# Patient Record
Sex: Female | Born: 1935 | Race: White | Hispanic: No | Smoking: Former smoker
Health system: Southern US, Community
[De-identification: ages and names within clinical notes are randomized; demographics above are authoritative.]

## PROBLEM LIST (undated history)

## (undated) DIAGNOSIS — H9190 Unspecified hearing loss, unspecified ear: Secondary | ICD-10-CM

## (undated) DIAGNOSIS — M259 Joint disorder, unspecified: Secondary | ICD-10-CM

## (undated) DIAGNOSIS — B0229 Other postherpetic nervous system involvement: Secondary | ICD-10-CM

## (undated) HISTORY — PX: CHOLECYSTECTOMY: SHX55

## (undated) HISTORY — DX: Joint disorder, unspecified: M25.9

## (undated) HISTORY — DX: Unspecified hearing loss, unspecified ear: H91.90

## (undated) HISTORY — PX: EXTERNAL EAR SURGERY: SHX627

## (undated) HISTORY — DX: Other postherpetic nervous system involvement: B02.29

---

## 2001-03-04 ENCOUNTER — Ambulatory Visit (HOSPITAL_COMMUNITY): Admission: RE | Admit: 2001-03-04 | Discharge: 2001-03-04 | Payer: Self-pay | Admitting: Internal Medicine

## 2001-03-04 ENCOUNTER — Encounter: Payer: Self-pay | Admitting: Internal Medicine

## 2001-06-20 ENCOUNTER — Encounter: Payer: Self-pay | Admitting: Internal Medicine

## 2001-06-20 ENCOUNTER — Ambulatory Visit (HOSPITAL_COMMUNITY): Admission: RE | Admit: 2001-06-20 | Discharge: 2001-06-20 | Payer: Self-pay | Admitting: Internal Medicine

## 2001-11-09 ENCOUNTER — Encounter: Payer: Self-pay | Admitting: Internal Medicine

## 2001-11-09 ENCOUNTER — Ambulatory Visit (HOSPITAL_COMMUNITY): Admission: RE | Admit: 2001-11-09 | Discharge: 2001-11-09 | Payer: Self-pay | Admitting: Internal Medicine

## 2002-03-15 ENCOUNTER — Ambulatory Visit (HOSPITAL_COMMUNITY): Admission: RE | Admit: 2002-03-15 | Discharge: 2002-03-15 | Payer: Self-pay | Admitting: Internal Medicine

## 2002-03-15 ENCOUNTER — Encounter: Payer: Self-pay | Admitting: Internal Medicine

## 2002-04-26 ENCOUNTER — Ambulatory Visit (HOSPITAL_COMMUNITY): Admission: RE | Admit: 2002-04-26 | Discharge: 2002-04-26 | Payer: Self-pay | Admitting: Internal Medicine

## 2003-05-24 ENCOUNTER — Ambulatory Visit (HOSPITAL_COMMUNITY): Admission: RE | Admit: 2003-05-24 | Discharge: 2003-05-24 | Payer: Self-pay | Admitting: Internal Medicine

## 2003-05-24 ENCOUNTER — Encounter: Payer: Self-pay | Admitting: Internal Medicine

## 2004-02-03 ENCOUNTER — Emergency Department (HOSPITAL_COMMUNITY): Admission: EM | Admit: 2004-02-03 | Discharge: 2004-02-04 | Payer: Self-pay | Admitting: Emergency Medicine

## 2004-04-17 ENCOUNTER — Inpatient Hospital Stay (HOSPITAL_COMMUNITY): Admission: EM | Admit: 2004-04-17 | Discharge: 2004-04-18 | Payer: Self-pay | Admitting: Emergency Medicine

## 2004-06-13 ENCOUNTER — Ambulatory Visit (HOSPITAL_COMMUNITY): Admission: RE | Admit: 2004-06-13 | Discharge: 2004-06-13 | Payer: Self-pay | Admitting: Internal Medicine

## 2005-09-21 ENCOUNTER — Ambulatory Visit (HOSPITAL_COMMUNITY): Admission: RE | Admit: 2005-09-21 | Discharge: 2005-09-21 | Payer: Self-pay | Admitting: Internal Medicine

## 2005-10-12 ENCOUNTER — Ambulatory Visit (HOSPITAL_COMMUNITY): Admission: RE | Admit: 2005-10-12 | Discharge: 2005-10-12 | Payer: Self-pay | Admitting: Internal Medicine

## 2006-09-30 ENCOUNTER — Ambulatory Visit (HOSPITAL_COMMUNITY): Admission: RE | Admit: 2006-09-30 | Discharge: 2006-09-30 | Payer: Self-pay | Admitting: Internal Medicine

## 2007-01-10 ENCOUNTER — Ambulatory Visit (HOSPITAL_COMMUNITY): Admission: RE | Admit: 2007-01-10 | Discharge: 2007-01-10 | Payer: Self-pay | Admitting: Internal Medicine

## 2007-10-04 ENCOUNTER — Ambulatory Visit (HOSPITAL_COMMUNITY): Admission: RE | Admit: 2007-10-04 | Discharge: 2007-10-04 | Payer: Self-pay | Admitting: Internal Medicine

## 2008-01-17 ENCOUNTER — Ambulatory Visit (HOSPITAL_COMMUNITY): Admission: RE | Admit: 2008-01-17 | Discharge: 2008-01-17 | Payer: Self-pay | Admitting: Internal Medicine

## 2008-02-03 ENCOUNTER — Ambulatory Visit: Payer: Self-pay | Admitting: Gastroenterology

## 2008-02-03 ENCOUNTER — Inpatient Hospital Stay (HOSPITAL_COMMUNITY): Admission: EM | Admit: 2008-02-03 | Discharge: 2008-02-06 | Payer: Self-pay | Admitting: Emergency Medicine

## 2008-02-04 ENCOUNTER — Ambulatory Visit: Payer: Self-pay | Admitting: Gastroenterology

## 2008-02-06 ENCOUNTER — Ambulatory Visit: Payer: Self-pay | Admitting: Gastroenterology

## 2008-02-13 ENCOUNTER — Ambulatory Visit (HOSPITAL_COMMUNITY): Admission: RE | Admit: 2008-02-13 | Discharge: 2008-02-13 | Payer: Self-pay | Admitting: Internal Medicine

## 2008-03-06 ENCOUNTER — Ambulatory Visit (HOSPITAL_COMMUNITY): Admission: RE | Admit: 2008-03-06 | Discharge: 2008-03-06 | Payer: Self-pay | Admitting: Gastroenterology

## 2008-03-12 ENCOUNTER — Ambulatory Visit: Payer: Self-pay | Admitting: Gastroenterology

## 2008-10-08 ENCOUNTER — Ambulatory Visit (HOSPITAL_COMMUNITY): Admission: RE | Admit: 2008-10-08 | Discharge: 2008-10-08 | Payer: Self-pay | Admitting: Internal Medicine

## 2009-10-23 ENCOUNTER — Ambulatory Visit (HOSPITAL_COMMUNITY): Admission: RE | Admit: 2009-10-23 | Discharge: 2009-10-23 | Payer: Self-pay | Admitting: Internal Medicine

## 2010-10-31 ENCOUNTER — Ambulatory Visit (HOSPITAL_COMMUNITY)
Admission: RE | Admit: 2010-10-31 | Discharge: 2010-10-31 | Payer: Self-pay | Source: Home / Self Care | Attending: Internal Medicine | Admitting: Internal Medicine

## 2011-03-17 NOTE — Op Note (Signed)
Sylvia Turner, Sylvia Turner                ACCOUNT NO.:  [id number]   MEDICAL RECORD NO.:  [id number]          PATIENT TYPE:  INP   LOCATION:  A215                          FACILITY:  [facility]   PHYSICIAN:  Kassie Mends, M.D.      DATE OF BIRTH:  [birth year]   DATE OF PROCEDURE:  02/03/2008  DATE OF DISCHARGE:                               OPERATIVE REPORT   REFERRING PHYSICIAN:  Kingsley Callander. Ouida Sills, MD   PROCEDURE:  Limited colonoscopy.   INDICATION FOR EXAM:  Sylvia Turner is a 75 year old female who presented  with painless rectal bleeding.  Nuclear medicine scan done today showed  active bleeding in the descending colon.  She has a past medical history  of diverticulosis.   FINDINGS:  1. Scope advanced to approximately 90 cm from the anal verge.  It      appeared to be in the mid transverse colon.  A large amount of old      blood clots and stool limited any further advancement of the scope.      The scope was slowly withdrawn to try to identify any active      bleeding from a diverticulum.  She had fresh blood clots in her      colon beginning in the rectum, and extending into approximately the      mid transverse colon.  No active bleeding was noted.  No ulcers      were seen.  She had multiple descending colon, and sigmoid colon      diverticula.  Her diverticulosis appeared to be most pronounced      between 50 and 70 cm from the anal verge.  No polyps, inflammatory      changes, or AVMs were seen.  Views of the mucosa were slightly      limited due to fresh blood in the colon lumen.  The residual blood      left in the colon to be easily washed away and aspirated.  2. Small internal hemorrhoids otherwise normal retroflex view of the      rectum.   DIAGNOSIS:  Most likely reason for Sylvia Turner's rectal bleeding is a  diverticular bleed.   RECOMMENDATIONS:  1. May advance to full liquid diet.  She does not appear to have any      additional active bleeding.  2. Hemoglobin has dropped and 12 to  9.4.  Will transfuse 1 unit of      packed red blood cells.  3. If she re-bleeds rather, would consider angiography to stop the      bleeding.  The other alternative is a surgery consult a left      hemicolectomy.  4. She should avoid aspirin, NSAIDs, and anticoagulation.  5. Daily PPI.   MEDICATIONS:  1. Demerol 50 mg IV.  2. Versed 4 mg IV.   PROCEDURE TECHNIQUE:  Physical exam was performed and an informed  consent was obtained from the patient after explaining the benefits,  risks, and alternatives to the procedure.  The patient was connected to  the monitor, and placed in  the left lateral position.  Continuous oxygen  was provided by nasal cannula and IV medicine administered through an  indwelling cannula.  After administration of sedation and a rectal exam,  the patient's rectum  was intubated, and the scope was advanced under direct visualization to  the mid transverse colon.  The scope was removed slowly by carefully  examining the color, texture, anatomy, and integrity of the mucosa on  the way out.  The patient was recovered in endoscopy, and discharged to  the floor in satisfactory condition.      Kassie Mends, M.D.  Electronically Signed     SM/MEDQ  D:  02/03/2008  T:  02/03/2008  Job:  [id number]   cc:   Kingsley Callander. Ouida Sills, MD  Fax: [phone]

## 2011-03-17 NOTE — Consult Note (Signed)
NAMEROGENE, METH                ACCOUNT NO.:  [id number]   MEDICAL RECORD NO.:  [id number]          PATIENT TYPE:  INP   LOCATION:  A215                          FACILITY:  [facility]   PHYSICIAN:  Kassie Mends, M.D.      DATE OF BIRTH:  1935/12/29   DATE OF CONSULTATION:  02/03/2008  DATE OF DISCHARGE:                                 CONSULTATION   REASON FOR CONSULTATION:  Hematochezia.   REQUESTING PHYSICIAN:  Kingsley Callander. Ouida Sills, M.D.   HISTORY OF PRESENT ILLNESS:  The patient is a 75 year old Caucasian  female who presented with acute onset pain with large volume  hematochezia with blood clots. She said she developed diarrhea last  week. She thought it was related to a medication that she was given, but  she cannot recall the name of it. It was potentially an NSAID. She does  not believe it was an antibiotic. She was out of town and stopped the  medication, and her diarrhea initially improved. Last weekend, she  decided to resume the medication as her physician had recommended this.  Over the weekend, her diarrhea returned. She states she has some  hemorrhoidal irritation with some bright red blood noted on the toilet  tissue. Yesterday, evening, however, she got the urge to have a bowel  movement. She went to the bathroom and passed large amounts of fresh  blood with clots. She has had five or six episodes. She denies any  abdominal pain, nausea or vomiting, fever or chills. She has occasional  heartburn which she takes ranitidine for. Chronically, she has some mild  constipation which she treats with Metamucil. No dysphagia or  odynophagia. She denies any lightheadedness, chest pain, or shortness of  breath. She had a colonoscopy in May of 2003 by Dr. Jena Gauss. She had  sigmoid diverticulosis. No family history of colorectal cancer.   MEDICATIONS AT HOME:  1. Magnesium 800 mg daily.  2. Methylprednisone 4 mg daily.  3. Ranitidine 150 mg every night.  4. Levothyroxine 88 mcg daily.  5.  Xanax 0.125 mg every night.  6. Multivitamin daily.  7. One teaspoon mustard p.r.n.  8. Recently began a new medication she does not recall the name.   ALLERGIES:  BEE STINGS.   PAST MEDICAL HISTORY:  1. Hypothyroidism.  2. Osteoarthritis.  3. Polio as a child.  4. Hysterectomy.  5. Cholecystectomy.  6. Left ear surgery.  7. Eyelid surgery.  8. Colonoscopy as above.   FAMILY HISTORY:  Father deceased secondary to black-lung disease. Mother  had dementia and MI. Brother had diabetes. No family history of  colorectal cancer or IBD.   SOCIAL HISTORY:  She is married. 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 She had remote tobacco use,  smoked for about 10 years. No alcohol or drug use.   REVIEW OF SYSTEMS:  See HPI for GI and constitutional. No weight loss.  See HPI for cardiopulmonary. GENITOURINARY:  No dysuria, hematuria.   PHYSICAL EXAMINATION:  Temperature 97.7, pulse 69, respirations 20,  blood pressure 143/80,  O2 saturation 90% on room air. Height 62 inches,  weight 66.1 kg.  GENERAL:  Pleasant, well-developed, well-nourished, elderly, Caucasian  female in no acute distress.  SKIN:  Warm and dry. No jaundice.  HEENT:  Sclerae nonicteric. Oropharyngeal mucosa moist and pink. No  lymphadenopathy.  CHEST:  Lungs are clear to auscultation.  CARDIAC:  Reveals regular rate and rhythm. Normal S1 and S2. No murmurs,  rubs, or gallops.  ABDOMEN:  Positive bowel sounds. Abdomen soft, nontender, nondistended.  No organomegaly or masses. No rebound or guarding. No abdominal bruits  or hernias.  RECTAL EXAM:  Done in the ED, she was found to have gross blood per  rectum.  LOWER EXTREMITIES:  No edema.   LABORATORY DATA:  White count 15,400, hemoglobin 12.1 on admission which  was at 1:00 a.m. this morning - her hemoglobin is now 10.1, platelets  337,000. Sodium 135, potassium 4.3, BUN 20, creatinine 0.68, glucose  149. INR 0.9, PTT  33.   IMPRESSION:  The patient is a 75 year old lady admitted with acute  onset, painless, large volume hematochezia, likely due to a diverticular  bleed. She possibly had recent NSAID use over the past two weeks, and if  so, we cannot exclude the possibility of NSAID-induced colopathy. Less  likely, dealing with colorectal cancer.   RECOMMENDATIONS:  1. Will retrieve name of recent medication.  2. Serial H&H, transfuse as needed.  3. Would provide supportive measures and monitor for now. If the      bleeding stops within the next 24 hours, would offer her an      outpatient colonoscopy to rule out other causes of bleeding given      that her colonoscopy was over six years ago. If her bleeding does      not stop, then she would likely need a colonoscopy while inpatient.      I discussed the above findings with Dr. Cira Servant. Plan is outlined per      her recommendations. Would continue clear liquid diet for now. I      would like to thank Dr. Carylon Perches for allowing [place] to take part in      the care of this patient.   ADDENDUM:  Spoke with patient. She denies any abdominal associated were  her rectal bleeding. She has had 5 episodes. The last one was at 1015.  She said it was a smaller amount and less red. The most likely etiology  for her BRBPR is a diverticular bleed. She has a low likelihood that  this is an NSAID-induced colon ulcer/colitis.  She was taking Diclofenac  as an outpatient. Agree with supportive mesaures and will obtain a  bleeding scan. If bleeding  stops, and Hb stable, paatient may be discharged on Sat or Sun. She  requests any outpatient procedures be scheduled after April 9. If  bleeding persists and Nuclear medicine scan is negative, would plan to  perform TCS on Sunday or Monday or would consider repeat Nuc Med scan +/-  angiography.      Tana Coast, P.A.      Kassie Mends, M.D.  Electronically Signed    LL/MEDQ  D:  02/03/2008  T:  02/03/2008  Job:   [id number]   cc:   Kingsley Callander. Ouida Sills, MD  Fax: [phone]

## 2011-03-17 NOTE — Discharge Summary (Signed)
NAME:  Sylvia Turner, Sylvia Turner                ACCOUNT NO.:  [id number]   MEDICAL RECORD NO.:  [id number]          PATIENT TYPE:  INP   LOCATION:  A215                          FACILITY:  [facility]   PHYSICIAN:  Kingsley Callander. Ouida Sills, MD       DATE OF BIRTH:  1936-11-02   DATE OF ADMISSION:  02/03/2008  DATE OF DISCHARGE:  LH                               DISCHARGE SUMMARY   DISCHARGE DIAGNOSES:  1. Lower gastrointestinal bleed likely due to diverticulosis.  2. Hypothyroidism.  3. Left thoracic radiculopathy.  4. History of polio.   HOSPITAL COURSE:  This patient is a 75 year old female who presented  with bright red painless rectal bleeding.  Her initial hemoglobin was  12.1.  A nuclear medicine bleeding scan revealed bleeding from the  descending colon.  She underwent a flexible sigmoidoscopy by Dr. Cira Servant.  Fresh blood and clots were seen in the descending colon with active  bleeding noted.  She received 3 units of packed red cells.  Her bleeding  stopped.  Her hemoglobin dropped from 11.1 to 9.8 from February 05, 2008, to  February 06, 2008.  She has had no bleeding and no hypotension.  She will be  discharged, but will be seen in followup in 2 days with a repeat  hemoglobin.  She will undergo an outpatient colonoscopy in 1-2 weeks.   DISCHARGE MEDICATIONS:  1. Synthroid 88 mcg daily.  2. Alprazolam 0.25 mg nightly.  3. Multivitamin daily.  4. Magnesium daily.  5. Potassium daily.  6. Calcium daily.  7. Vitamin D daily.   She was advised to avoid all aspirin and nonsteroidal anti-inflammatory  drugs.      Kingsley Callander. Ouida Sills, MD  Electronically Signed     ROF/MEDQ  D:  02/06/2008  T:  02/06/2008  Job:  [id number]

## 2011-03-17 NOTE — H&P (Signed)
NAME:  Sylvia Turner, Sylvia Turner                ACCOUNT NO.:  [id number]   MEDICAL RECORD NO.:  [id number]          PATIENT TYPE:  INP   LOCATION:  A215                          FACILITY:  [facility]   PHYSICIAN:  Kingsley Callander. Ouida Sills, MD       DATE OF BIRTH:  [birth year]   DATE OF ADMISSION:  02/03/2008  DATE OF DISCHARGE:  LH                              HISTORY & PHYSICAL   CHIEF COMPLAINT:  Rectal bleeding.   HISTORY OF PRESENT ILLNESS:  This patient is a 75 year old white female  who presented to the emergency room after developing rectal bleeding.  She first began bleeding about 11:00 p.m. She did not experience  abdominal pain or perianal pain. She went to bed and then got back up  and had continued bleeding and came to the emergency room. She was not  hypotensive nor anemic. She has a history of diverticulosis on  colonoscopy in 2003 by Dr. Jena Gauss.  She has taken two doses of Advil this  week. She has been on Medrol dose back for a thoracic radiculopathy. She  is not on Coumadin or daily aspirin.   PAST MEDICAL HISTORY:  Hysterectomy, cholecystectomy, left ear surgery,  eyelid surgery, left thoracic radiculopathy, polio with resultant left  leg atrophy, hypothyroidism, insomnia, bee sting allergy.   MEDICATIONS:  Magnesium, potassium, calcium, vitamin D, multivitamin,  Synthroid 88 mcg daily, alprazolam 0.25 mg q.h.s. Medrol Dosepak.   ALLERGIES:  Bee stings.   SOCIAL HISTORY:  She does not smoke cigarettes, drink alcohol or use  recreational drugs.   FAMILY HISTORY:  Her father had lung cancer.  Mother had dementia.  A  brother and sister have had diabetes and cirrhosis.   REVIEW OF SYSTEMS:  No chest pain, syncope, shortness of breath,  abdominal pain.  She had persistent radicular left thoracic pain. A  recent chest x-ray and rib x-rays were negative.   PHYSICAL EXAMINATION:  Pulse 84, blood pressure 140/80.  GENERAL:  Alert and in no distress.  HEENT:  No scleral icterus. Mucus membranes  appear normal.  Oropharynx  unremarkable.  NECK:  No JVD or thyromegaly.  LUNGS:  Clear.  HEART:  Regular with no murmurs.  ABDOMEN:  Nontender.  No hepatosplenomegaly.  EXTREMITIES:  She has atrophy of the left leg, no edema.  NEURO:  Grossly intact.  LYMPH NODES:  No enlargement.  SKIN:  Warm and dry.   LABORATORY DATA:  Hemoglobin 12.1, white count 15,000. Met 7 is normal  except for glucose in the 150 range.   IMPRESSION:  1. Rectal bleeding. She has known diverticulosis.  There is no sign of      polyp or tumor in 2003.  Will consult gastroenterology for      additional evaluation.  A repeat hemoglobin is pending. She was      started on IV Protonix empirically.  2. Hypothyroidism.  Continue Synthroid.  3. Insomnia.  Continue alprazolam.  4. History of polio.  5. Leukocytosis.  6. Left thoracic radiculopathy. Will hold her Medrol Dosepak for now.      Kingsley Callander. Ouida Sills,  MD  Electronically Signed     ROF/MEDQ  D:  02/03/2008  T:  02/03/2008  Job:  [id number]

## 2011-03-17 NOTE — Op Note (Signed)
NAME:  Sylvia Turner, Sylvia Turner                ACCOUNT NO.:  [id number]   MEDICAL RECORD NO.:  [id number]          PATIENT TYPE:  AMB   LOCATION:  DAY                           FACILITY:  [facility]   PHYSICIAN:  Kassie Mends, M.D.      DATE OF BIRTH:  May 30, 1936   DATE OF PROCEDURE:  03/06/2008  DATE OF DISCHARGE:                               OPERATIVE REPORT   REFERRING PHYSICIAN:  Kingsley Callander. Ouida Sills, MD.   PROCEDURE:  Colonoscopy.   INDICATION FOR EXAM:  Sylvia Turner is a 75 year old female who was placed  on anti-inflammatory drug and developed rectal bleeding.  She had a  nuclear medicine scan in April 2009 which revealed bleeding in the  descending colon.  The limited colonoscopy that was performed during  that hospitalization showed a large amount of old blood and clots as  well as stool which limited a complete exam.  She presents with no  further rectal bleeding or abdominal pain.   FINDINGS:  Descending colon and sigmoid colon diverticula, which were  frequent.  Large internal hemorrhoids.  Otherwise no polyps, masses,  inflammatory changes, or AVM seen.   DIAGNOSIS:  The most likely reason for Sylvia Turner rectal bleeding was a  diverticular bleed.  She has no evidence of diverticula in the right  colon or the transverse colon.   RECOMMENDATIONS:  1. She should have a screening colonoscopy in 10 years.  2. She should follow a high-fiber diet.  She is given a handout on      high-fiber diet, diverticulosis, and hemorrhoids.  3. She should avoid the anti-inflammatory drug that precipitated this      event.   MEDICATIONS:  1. Demerol 50 mg IV.  2. Versed 4 mg IV.   PROCEDURE TECHNIQUE:  Physical exam was performed.  Informed consent was  obtained from the patient after explaining the benefits, risks, and  alternatives to the procedure.  The patient was connected to the monitor  and placed in left lateral position.  Continuous oxygen was provided via  nasal cannula and IV medicine  administered through an indwelling  cannula.  After administration of sedation and rectal exam, the  patient's rectum was intubated  and the scope was advanced under direct visualization to the cecum.  The  prep was excellent.  The scope was removed slowly by carefully examining  the color, texture, anatomy, and integrity of the mucosa on the way out.  The patient was recovered in endoscopy and discharged home in  satisfactory condition.      Kassie Mends, M.D.  Electronically Signed     SM/MEDQ  D:  03/06/2008  T:  03/06/2008  Job:  [id number]   cc:   Kingsley Callander. Ouida Sills, MD  Fax: [phone]

## 2011-03-17 NOTE — Group Therapy Note (Signed)
NAME:  Sylvia Turner, MOUSTAFA                ACCOUNT NO.:  [id number]   MEDICAL RECORD NO.:  [id number]          PATIENT TYPE:  INP   LOCATION:  A215                          FACILITY:  [facility]   PHYSICIAN:  Angus G. Renard Matter, MD   DATE OF BIRTH:  02/12/1936   DATE OF PROCEDURE:  DATE OF DISCHARGE:                                 PROGRESS NOTE   This patient admitted with rectal bleeding without abdominal pain or  perineal pain.  She has remained normotensive.  She did have to have  another unit of packed cells yesterday, is being seen by GI service.   OBJECTIVE:  VITAL SIGNS:  Blood pressure 105/63, respiration 20, pulse  69, temperature 96.8.  Her current hemoglobin 8.6, hematocrit 24.0.  Chemistries within normal range.  HEART:  Regular rhythm.  LUNGS:  Clear to P&A.  ABDOMEN:  No palpable organs or masses   ASSESSMENT:  The patient does have history of rectal bleeding, remains  anemic; does have hypothyroidism, insomnia, leukocytosis.  Will continue  to replace blood, continue current regimen.      Angus G. Renard Matter, MD  Electronically Signed     AGM/MEDQ  D:  02/05/2008  T:  02/05/2008  Job:  [id number]

## 2011-03-17 NOTE — Group Therapy Note (Signed)
NAME:  Sylvia Turner, Sylvia Turner                ACCOUNT NO.:  [id number]   MEDICAL RECORD NO.:  [id number]          PATIENT TYPE:  INP   LOCATION:  A215                          FACILITY:  [facility]   PHYSICIAN:  Angus G. Renard Matter, MD   DATE OF BIRTH:  September 09, 1936   DATE OF PROCEDURE:  DATE OF DISCHARGE:                                 PROGRESS NOTE   ASSESSMENT:  The patient was admitted with rectal bleeding without  abdominal or perineal pain. She remained normotensive. History of  diverticulosis noted by colonoscopy 2003.  She has received a unit of  blood, is being seen by GI service.   OBJECTIVE:  VITAL SIGNS:  Blood pressure 113/57, respiration 20, pulse  78, temp 98.1. Most recent hemoglobin 8.0. RBC's 2.56, hematocrit 22.6.  CHEMISTRIES:  Essentially normal.  HEART:  Regular rhythm.  LUNGS:  Clear to P&A.  ABDOMEN:  No palpable organs or masses.  No organomegaly.   ASSESSMENT:  Patient admitted with rectal bleeding. She remains anemic.  Does have hypothyroidism, insomnia, leukocytosis.   PLAN:  To continue to replace blood. Will discuss with GI service.      Angus G. Renard Matter, MD  Electronically Signed     AGM/MEDQ  D:  02/04/2008  T:  02/04/2008  Job:  [id number]

## 2011-03-20 NOTE — Op Note (Signed)
[facility]  Patient:    Sylvia Turner, Sylvia Turner Visit Number: [id number] MRN: [id number]          Service Type: END Location: DAY Attending Physician:  Jonathon Bellows Dictated by:   Roetta Sessions, M.D. Proc. Date: 04/26/02 Admit Date:  04/26/2002   CC:         Carylon Perches, M.D.   Operative Report  PROCEDURE:  Screening colonoscopy.  REFERRING PHYSICIAN:  Carylon Perches, M.D.  INDICATION FOR PROCEDURE:  The patient is a 75 year old lady followed primarily by Dr. Ouida Sills referred for colorectal cancer screening. She is devoid of any large GI tract symptoms. No family history of colorectal cancer. Prior imaging of her colon via sigmoidoscopy five years ago demonstrated only diverticulosis. Colonoscopy is now being done as standard screening maneuvers and the approach has been discussed with the patient. The potential risks, benefits, and alternatives have been reviewed and questions answered. She is agreeable. She is low-risk for conscious sedation. Please see my handwritten H&P for more information.  PROCEDURE NOTE:  O2 saturation, blood pressure, pulse, and respirations were monitored throughout the entire procedure.  ANESTHESIA:  Conscious sedation, Versed 4 mg IV, Demerol 75 mg IV in divided doses.  INSTRUMENT:  Olympus videoscopic colonoscope.  FINDINGS:  Digital rectal exam revealed no abnormalities.  ENDOSCOPIC FINDINGS:  The prep was good.  Rectum and colon examination:  The rectal mucosa including retroflexed view of the anal verge revealed no abnormalities.  Descending colon:   The colonic mucosa was surveyed from the rectosigmoid junction through the left transverse right colon to the area of the appendiceal orifice, ileocecal valve, and cecum. These structures were all seen and photographed for the record. The patient was noted to have sigmoid diverticula. The remainder of the colonic mucosa appeared normal from the level of the cecum and  ileocecal valve. The scope was slowly and cautiously withdrawn. All previously mentioned mucosal surfaces were again seen. Again, no other abnormalities were observed. The patient tolerated the procedure well and is reacting.  ENDOSCOPY IMPRESSION: 1. Normal rectum. 2. Sigmoid diverticulosis. Remaining colonic mucosal appeared normal.  RECOMMENDATIONS: 1. Diverticulosis literature provided to the patient. 2. Repeat colonoscopy in 10 years. 3. Follow up with Dr. Ouida Sills. Dictated by:   Roetta Sessions, M.D. Attending Physician:  Jonathon Bellows DD:  04/26/02 TD:  04/27/02 Job: [id number]

## 2011-03-20 NOTE — Discharge Summary (Signed)
NAME:  Sylvia Turner, Sylvia Turner                          ACCOUNT NO.:  [id number]   MEDICAL RECORD NO.:  [id number]                   PATIENT TYPE:  INP   LOCATION:  A209                                 FACILITY:  [facility]   PHYSICIAN:  Kingsley Callander. Ouida Sills, M.D.                  DATE OF BIRTH:  1936/03/29   DATE OF ADMISSION:  04/17/2004  DATE OF DISCHARGE:  04/18/2004                                 DISCHARGE SUMMARY   DISCHARGE DIAGNOSES:  1. Bee sting on the right first toe.  2. Fever.  3. Hypothyroidism.  4. History of polio.   HOSPITAL COURSE:  This patient is a 75 year old female who presented to the  emergency room one day after a bee sting to the right first toe.  She  developed swelling, bruising and redness.  In the emergency room, she had a  fever to 103.8.  There was a substantial change in her blood pressure while  observed there with a drop from 157/70 to 99/51.  She was treated with IV  fluids.  She was hospitalized for observation and treated with Benadryl.  Pulse rate dropped from 128 to 99.  She had a normal white count at 7.8 with  95% neutrophils and 3% lymphocytes.  Her chest x-ray revealed no infiltrate.  Her urinalysis was negative.  Blood and urine cultures have thus far been  negative.  After hospitalization overnight, her fever had resolved.  Her  vital signs were normal, and she was felt to be stable for discharge.  The  swelling in her right foot improved.   She had taken an EpiPen at home.  She has not had tongue or lip swelling, or  any glottic closure-type symptoms.  She could probably treat any future bee  stings with Benadryl only.   DISCHARGE MEDICATIONS:  1. Synthroid 88 micrograms daily.  2. Alprazolam 0.25 mg q.h.s.  3. Calcium 1500 mg daily.  4. Vitamin D, 400 IU daily.  5. Multivitamin daily.     ___________________________________________                                         Kingsley Callander. Ouida Sills, M.D.   ROF/MEDQ  D:  04/18/2004  T:  04/19/2004  Job:  [id number]

## 2011-03-20 NOTE — H&P (Signed)
NAME:  Sylvia Turner, Sylvia Turner                          ACCOUNT NO.:  [id number]   MEDICAL RECORD NO.:  [id number]                   PATIENT TYPE:  INP   LOCATION:  A209                                 FACILITY:  [facility]   PHYSICIAN:  Kingsley Callander. Ouida Sills, M.D.                  DATE OF BIRTH:  1936-03-15   DATE OF ADMISSION:  04/17/2004  DATE OF DISCHARGE:                                HISTORY & PHYSICAL   CHIEF COMPLAINT:  Bee sting.   HISTORY OF PRESENT ILLNESS:  This patient is a 75 year old, white female who  presented to the emergency room one day after being stung on the right first  toe by a yellow jacket.  She has a history of a bee sting allergy.  She  waited a couple of hours and then took an EpiPen.  She awakened at 4 a.m.  with rigors and vomiting.  She then presented to the emergency room where  she was found to have a temperature of 103.8.  She was initially tachycardic  at 128.  With observation in the emergency room, her blood pressure dropped  from 157/70 to 99/51.   PAST MEDICAL HISTORY:  1. Hypothyroidism.  2. Osteoarthritis.  3. Polio as a child.  4. Hysterectomy.  5. Cholecystectomy.  6. Left ear surgery.  7. Eye lid surgery.   MEDICATIONS:  1. Synthroid 88 mcg q.d.  2. Xanax 0.25 mg q.h.s.  3. Calcium.  4. Multivitamin p.r.n.  5. Quinine p.r.n.   ALLERGIES:  BEE STINGS.   SOCIAL HISTORY:  She does not smoke cigarettes, use drugs or abuse alcohol.   FAMILY HISTORY:  Her father died of lung cancer.  Her mother had an MI and  dementia.  A brother has diabetes.   REVIEW OF SYMPTOMS:  HEENT:  No tongue or lip swelling.  RESPIRATORY:  No  trouble breathing.  CARDIAC:  No chest pain.  GASTROINTESTINAL:  No  abdominal pain.  No diarrhea.  She has had no hives.   PHYSICAL EXAMINATION:  VITAL SIGNS:  Temperature 103.8, blood pressure  157/70, pulse 128, respirations 20, oxygen saturation 94%.  GENERAL:  Alert and fully oriented.  HEENT:  Eyes and oropharynx were  unremarkable.  She has an upper denture.  No JVD or thyromegaly.  LUNGS:  Clear.  No wheezes.  HEART:  Regular with no murmurs.  ABDOMEN:  Nontender with no hepatosplenomegaly.  EXTREMITIES:  She has swelling, bruising and redness on the distal foot most  notable over the first toe.  Pulses are intact.  There is atrophy of the  left leg with hammertoe deformities.  NEUROLOGIC:  Intact.  LYMPH NODES:  No enlargement.   LABORATORY DATA AND X-RAY FINDINGS:  White count 7.8, hemoglobin 12.8,  platelets 222.  Sodium 140, potassium 4.3, glucose 119, BUN 15, creatinine  0.8.  Urinalysis is negative.  CBC revealed a 95% neutrophils and 3%  lymphs.   Chest x-ray is normal.   IMPRESSION:  1. Bee sting.  2. Fever.  3. Initial tachycardia with subsequent blood pressure drop.   PLAN:  1. She will be hospitalized and observed.  2. Benadryl p.o.  3. Blood and urine cultures.  4. She was treated with 1 g of Rocephin in the ER.     ___________________________________________                                         Kingsley Callander. Ouida Sills, M.D.   ROF/MEDQ  D:  04/18/2004  T:  04/18/2004  Job:  [id number]

## 2011-07-28 LAB — DIFFERENTIAL
Basophils Relative: 0
Eosinophils Absolute: 0.1
Eosinophils Relative: 1
Monocytes Absolute: 0.9
Monocytes Relative: 5
Monocytes Relative: 9
Neutro Abs: 12.7 — ABNORMAL HIGH
Neutro Abs: 5.8
Neutrophils Relative %: 57

## 2011-07-28 LAB — HEMOGLOBIN AND HEMATOCRIT, BLOOD
HCT: 24 — ABNORMAL LOW
HCT: 26.2 — ABNORMAL LOW
HCT: 26.6 — ABNORMAL LOW
HCT: 27.1 — ABNORMAL LOW
HCT: 28.1 — ABNORMAL LOW
HCT: 29.3 — ABNORMAL LOW
Hemoglobin: 10.1 — ABNORMAL LOW
Hemoglobin: 8.6 — ABNORMAL LOW
Hemoglobin: 9.4 — ABNORMAL LOW
Hemoglobin: 9.5 — ABNORMAL LOW
Hemoglobin: 9.8 — ABNORMAL LOW

## 2011-07-28 LAB — CBC
Hemoglobin: 8 — ABNORMAL LOW
MCHC: 34.3
MCV: 87.8
Platelets: 229
RBC: 4.01
RDW: 13.4
RDW: 13.6
WBC: 15.4 — ABNORMAL HIGH

## 2011-07-28 LAB — PROTIME-INR: Prothrombin Time: 12.6

## 2011-07-28 LAB — ABO/RH: ABO/RH(D): O POS

## 2011-07-28 LAB — BASIC METABOLIC PANEL
CO2: 26
Calcium: 9.1
Creatinine, Ser: 0.68
GFR calc non Af Amer: >60
Sodium: 135

## 2011-07-28 LAB — TYPE AND SCREEN
ABO/RH(D): O POS
Antibody Screen: NEGATIVE

## 2011-07-28 LAB — PREPARE RBC (CROSSMATCH)

## 2011-12-29 ENCOUNTER — Other Ambulatory Visit (HOSPITAL_COMMUNITY): Payer: Self-pay | Admitting: Internal Medicine

## 2011-12-29 DIAGNOSIS — M5414 Radiculopathy, thoracic region: Secondary | ICD-10-CM

## 2011-12-30 ENCOUNTER — Ambulatory Visit (HOSPITAL_COMMUNITY)
Admission: RE | Admit: 2011-12-30 | Discharge: 2011-12-30 | Disposition: A | Discharge status: Home / Self Care | Payer: Medicare Other | Source: Ambulatory Visit | Attending: Internal Medicine | Admitting: Internal Medicine

## 2011-12-30 DIAGNOSIS — M5124 Other intervertebral disc displacement, thoracic region: Secondary | ICD-10-CM | POA: Insufficient documentation

## 2011-12-30 DIAGNOSIS — M5414 Radiculopathy, thoracic region: Secondary | ICD-10-CM

## 2011-12-30 DIAGNOSIS — M546 Pain in thoracic spine: Secondary | ICD-10-CM | POA: Insufficient documentation

## 2013-01-26 ENCOUNTER — Other Ambulatory Visit (HOSPITAL_COMMUNITY): Payer: Self-pay | Admitting: Internal Medicine

## 2013-01-26 DIAGNOSIS — Z139 Encounter for screening, unspecified: Secondary | ICD-10-CM

## 2013-02-14 ENCOUNTER — Ambulatory Visit (HOSPITAL_COMMUNITY)
Admission: RE | Admit: 2013-02-14 | Discharge: 2013-02-14 | Disposition: A | Discharge status: Home / Self Care | Payer: Medicare Other | Source: Ambulatory Visit | Attending: Internal Medicine | Admitting: Internal Medicine

## 2013-02-14 DIAGNOSIS — Z139 Encounter for screening, unspecified: Secondary | ICD-10-CM

## 2013-02-14 DIAGNOSIS — Z1231 Encounter for screening mammogram for malignant neoplasm of breast: Secondary | ICD-10-CM | POA: Insufficient documentation

## 2014-02-26 ENCOUNTER — Other Ambulatory Visit (HOSPITAL_COMMUNITY): Payer: Self-pay | Admitting: Internal Medicine

## 2014-02-26 DIAGNOSIS — Z1231 Encounter for screening mammogram for malignant neoplasm of breast: Secondary | ICD-10-CM

## 2014-03-01 ENCOUNTER — Ambulatory Visit (HOSPITAL_COMMUNITY)
Admission: RE | Admit: 2014-03-01 | Discharge: 2014-03-01 | Disposition: A | Discharge status: Home / Self Care | Payer: Medicare HMO | Source: Ambulatory Visit | Attending: Internal Medicine | Admitting: Internal Medicine

## 2014-03-01 DIAGNOSIS — Z1231 Encounter for screening mammogram for malignant neoplasm of breast: Secondary | ICD-10-CM | POA: Insufficient documentation

## 2014-10-17 ENCOUNTER — Ambulatory Visit (INDEPENDENT_AMBULATORY_CARE_PROVIDER_SITE_OTHER): Payer: Commercial Managed Care - HMO | Admitting: Diagnostic Neuroimaging

## 2014-10-17 ENCOUNTER — Encounter: Payer: Self-pay | Admitting: Diagnostic Neuroimaging

## 2014-10-17 VITALS — BP 124/67 | HR 68 | Temp 97.8°F | Ht 61.5 in | Wt 139.8 lb

## 2014-10-17 DIAGNOSIS — B0229 Other postherpetic nervous system involvement: Secondary | ICD-10-CM

## 2014-10-17 NOTE — Patient Instructions (Signed)
Use lyrica as needed during flare up of post herpetic neuralgia pain.

## 2014-10-17 NOTE — Progress Notes (Signed)
[facility]  PATIENT: Sylvia Turner DOB: [birth year]  REFERRING CLINICIAN: R Fagan HISTORY FROM: patient  REASON FOR VISIT: new consult    HISTORICAL  CHIEF COMPLAINT:  Chief Complaint  Patient presents with  . Neurologic Problem    neuralgia    HISTORY OF PRESENT ILLNESS:   78 year old female with hypothyroidism, restless legs, insomnia, here for evaluation of post herpetic neuralgia. 2007 patient had new onset rash in left thoracic region, T5 dermatome, with electrical and shooting pain. Patient was diagnosed with shingles outbreak and treated with valacyclovir. Symptoms improved and she did well until 2009. Patient had recurrence of symptoms and was treated with Valtrex and Lyrica. Symptoms improved for some time. She has had intermittent flareups of symptoms over time and had follow-up evaluation with MRI of the thoracic spine. Patient was then referred to pain management due to presence of small disc protrusion/herniation at the T2-3 and T6-7 levels. Patient had intermittent treatment with Lyrica and Valtrex for recurrent symptoms. Last flareup of symptoms March 2015. Currently patient has no significant pain related to postherpetic neuralgia. Patient had tried gabapentin in the past but could not tolerate it. Patient is reluctant to take maintenance Lyrica due to concern of long-term side effects and cost.  REVIEW OF SYSTEMS: Full 14 system review of systems performed and notable only for easy bruising joint pain cramps allergy runny nose constipation hearing loss rash.  ALLERGIES: Allergies  Allergen Reactions  . Other     Ex. Perfume smells, candle shop    HOME MEDICATIONS: No outpatient prescriptions prior to visit.   No facility-administered medications prior to visit.    PAST MEDICAL HISTORY: Past Medical History  Diagnosis Date  . Deaf L Ear  . Post herpetic neuralgia   . Hip problem     PAST SURGICAL HISTORY: Past Surgical History    Procedure Laterality Date  . External ear surgery    . Cholecystectomy      FAMILY HISTORY: Family History  Problem Relation Age of Onset  . Dementia Mother   . Other Father     Black Lung    SOCIAL HISTORY:  History   Social History  . Marital Status: Married    Spouse Name: Margarette Asal    Number of Children: 3  . Years of Education: 12+   Occupational History  . Retired    Social History Main Topics  . Smoking status: Former Smoker -- 0.50 packs/day for 10 years    Types: Cigarettes    Quit date: 11/02/1988  . Smokeless tobacco: Never Used  . Alcohol Use: 0.0 oz/week    0 Not specified per week     Comment: rarely 1 glass of wine  . Drug Use: No  . Sexual Activity: Not on file   Other Topics Concern  . Not on file   Social History Narrative   Patient lives at home with spouse.   Caffeine Use: 3 cups daily     PHYSICAL EXAM  Filed Vitals:   10/17/14 0842  BP: 124/67  Pulse: 68  Temp: 97.8 F (36.6 C)  TempSrc: Oral  Height: 5' 1.5" (1.562 m)  Weight: 139 lb 12.8 oz (63.413 kg)    Body mass index is 25.99 kg/(m^2).   Visual Acuity Screening   Right eye Left eye Both eyes  Without correction: 20/40 20/50   With correction:       No flowsheet data found.  GENERAL EXAM: Patient is in no distress; well  developed, nourished and groomed; neck is supple  CARDIOVASCULAR: Regular rate and rhythm, no murmurs, no carotid bruits  NEUROLOGIC: MENTAL STATUS: awake, alert, oriented to person, place and time, recent and remote memory intact, normal attention and concentration, language fluent, comprehension intact, naming intact, fund of knowledge appropriate CRANIAL NERVE: no papilledema on fundoscopic exam, pupils equal and reactive to light, visual fields full to confrontation, extraocular muscles intact, no nystagmus, facial sensation and strength symmetric, hearing intact, palate elevates symmetrically, uvula midline, shoulder shrug symmetric,  tongue midline. MOTOR: normal bulk and tone, full strength in the BUE, BLE; EXCEPT ATROPHY OF LEFT LOWER LEG/FOOT FROM POLIO SENSORY: normal and symmetric to light touch, pinprick, temperature, vibration COORDINATION: finger-nose-finger, fine finger movements normal REFLEXES: deep tendon reflexes present and symmetric; ABSENT IN LEFT LEG GAIT/STATION: narrow based gait; ANTALGIC GAIT FROM RIGHT HIP PAIN; romberg is negative    DIAGNOSTIC DATA (LABS, IMAGING, TESTING) - I reviewed patient records, labs, notes, testing and imaging myself where available.  Lab Results  Component Value Date   WBC 10.2 02/04/2008   HGB 9.8* 02/06/2008   HCT 28.1* 02/06/2008   MCV 88.0 02/04/2008   PLT 229 DELTA CHECK NOTED 02/04/2008      Component Value Date/Time   NA 135 02/03/2008 0117   K 4.3 02/03/2008 0117   CL 104 02/03/2008 0117   CO2 26 02/03/2008 0117   GLUCOSE 149* 02/03/2008 0117   BUN 20 02/03/2008 0117   CREATININE 0.68 02/03/2008 0117   CALCIUM 9.1 02/03/2008 0117   GFRNONAA >60 02/03/2008 0117   GFRAA  02/03/2008 0117    >60        The eGFR has been calculated using the MDRD equation. This calculation has not been validated in all clinical   No results found for: CHOL, HDL, LDLCALC, LDLDIRECT, TRIG, CHOLHDL No results found for: HGBA1C No results found for: VITAMINB12 No results found for: TSH   I reviewed images myself and agree with interpretation. Also degenerative disc bulging and spondylosis from C4-5 to C7-T1 noted.  -VRP  02/14/08 MRI thoracic spine - Small left paracentral disc herniation at T2-3 which could possibly relate to the patient's symptoms. Minor degenerative changes elsewhere as described above.  12/30/11 MRI thoracic spine - Regression of small T2-T3 disc protrusion, now a shallow bulge without stenosis. Tiny T6-T7 central disc protrusion without stenosis.     ASSESSMENT AND PLAN  78 y.o. year old female here with post herpetic neuralgia, with  intermittent pain, well controlled in the past with lyrica. Currently pain free.  PLAN: - continue lyrica as needed during flare ups of pain - could also consider maintenance duloxetine if cost is a factor with brand name lyrica  Return if symptoms worsen or fail to improve, for return to PCP.    Penni Bombard, MD 94/70/9628, 3:66 AM Certified in Neurology, Neurophysiology and [facility] [address] [place], [place] [address] [phone]

## 2014-11-09 ENCOUNTER — Encounter: Payer: Self-pay | Admitting: Diagnostic Neuroimaging

## 2014-11-22 DIAGNOSIS — M7061 Trochanteric bursitis, right hip: Secondary | ICD-10-CM | POA: Diagnosis not present

## 2015-01-01 DIAGNOSIS — H5210 Myopia, unspecified eye: Secondary | ICD-10-CM | POA: Diagnosis not present

## 2015-01-01 DIAGNOSIS — H524 Presbyopia: Secondary | ICD-10-CM | POA: Diagnosis not present

## 2015-01-15 DIAGNOSIS — M1812 Unilateral primary osteoarthritis of first carpometacarpal joint, left hand: Secondary | ICD-10-CM | POA: Diagnosis not present

## 2015-03-11 DIAGNOSIS — E039 Hypothyroidism, unspecified: Secondary | ICD-10-CM | POA: Diagnosis not present

## 2015-03-11 DIAGNOSIS — G4700 Insomnia, unspecified: Secondary | ICD-10-CM | POA: Diagnosis not present

## 2015-03-11 DIAGNOSIS — Z79899 Other long term (current) drug therapy: Secondary | ICD-10-CM | POA: Diagnosis not present

## 2015-03-11 DIAGNOSIS — K219 Gastro-esophageal reflux disease without esophagitis: Secondary | ICD-10-CM | POA: Diagnosis not present

## 2015-03-18 DIAGNOSIS — Z0001 Encounter for general adult medical examination with abnormal findings: Secondary | ICD-10-CM | POA: Diagnosis not present

## 2015-03-18 DIAGNOSIS — N390 Urinary tract infection, site not specified: Secondary | ICD-10-CM | POA: Diagnosis not present

## 2015-03-18 DIAGNOSIS — E039 Hypothyroidism, unspecified: Secondary | ICD-10-CM | POA: Diagnosis not present

## 2015-03-18 DIAGNOSIS — Z23 Encounter for immunization: Secondary | ICD-10-CM | POA: Diagnosis not present

## 2015-03-18 DIAGNOSIS — G2581 Restless legs syndrome: Secondary | ICD-10-CM | POA: Diagnosis not present

## 2015-04-04 DIAGNOSIS — M7061 Trochanteric bursitis, right hip: Secondary | ICD-10-CM | POA: Diagnosis not present

## 2015-04-04 DIAGNOSIS — M67911 Unspecified disorder of synovium and tendon, right shoulder: Secondary | ICD-10-CM | POA: Diagnosis not present

## 2015-10-01 DIAGNOSIS — Z23 Encounter for immunization: Secondary | ICD-10-CM | POA: Diagnosis not present

## 2015-10-03 DIAGNOSIS — M7061 Trochanteric bursitis, right hip: Secondary | ICD-10-CM | POA: Diagnosis not present

## 2015-10-03 DIAGNOSIS — M67911 Unspecified disorder of synovium and tendon, right shoulder: Secondary | ICD-10-CM | POA: Diagnosis not present

## 2016-01-14 DIAGNOSIS — M7061 Trochanteric bursitis, right hip: Secondary | ICD-10-CM | POA: Diagnosis not present

## 2016-02-18 ENCOUNTER — Other Ambulatory Visit (HOSPITAL_COMMUNITY): Payer: Self-pay | Admitting: Internal Medicine

## 2016-02-18 DIAGNOSIS — Z1231 Encounter for screening mammogram for malignant neoplasm of breast: Secondary | ICD-10-CM

## 2016-02-26 ENCOUNTER — Ambulatory Visit (HOSPITAL_COMMUNITY)
Admission: RE | Admit: 2016-02-26 | Discharge: 2016-02-26 | Disposition: A | Discharge status: Home / Self Care | Payer: Commercial Managed Care - HMO | Source: Ambulatory Visit | Attending: Internal Medicine | Admitting: Internal Medicine

## 2016-02-26 DIAGNOSIS — Z1231 Encounter for screening mammogram for malignant neoplasm of breast: Secondary | ICD-10-CM

## 2016-03-02 DIAGNOSIS — Z8612 Personal history of poliomyelitis: Secondary | ICD-10-CM | POA: Diagnosis not present

## 2016-03-02 DIAGNOSIS — K219 Gastro-esophageal reflux disease without esophagitis: Secondary | ICD-10-CM | POA: Diagnosis not present

## 2016-03-02 DIAGNOSIS — Z79899 Other long term (current) drug therapy: Secondary | ICD-10-CM | POA: Diagnosis not present

## 2016-03-02 DIAGNOSIS — G2581 Restless legs syndrome: Secondary | ICD-10-CM | POA: Diagnosis not present

## 2016-03-02 DIAGNOSIS — E039 Hypothyroidism, unspecified: Secondary | ICD-10-CM | POA: Diagnosis not present

## 2016-03-27 DIAGNOSIS — Z0001 Encounter for general adult medical examination with abnormal findings: Secondary | ICD-10-CM | POA: Diagnosis not present

## 2016-03-27 DIAGNOSIS — E030 Congenital hypothyroidism with diffuse goiter: Secondary | ICD-10-CM | POA: Diagnosis not present

## 2016-03-27 DIAGNOSIS — B0222 Postherpetic trigeminal neuralgia: Secondary | ICD-10-CM | POA: Diagnosis not present

## 2016-03-27 DIAGNOSIS — Z6826 Body mass index (BMI) 26.0-26.9, adult: Secondary | ICD-10-CM | POA: Diagnosis not present

## 2016-09-15 DIAGNOSIS — Z23 Encounter for immunization: Secondary | ICD-10-CM | POA: Diagnosis not present

## 2016-09-17 DIAGNOSIS — M25551 Pain in right hip: Secondary | ICD-10-CM | POA: Diagnosis not present

## 2016-09-17 DIAGNOSIS — M25511 Pain in right shoulder: Secondary | ICD-10-CM | POA: Diagnosis not present

## 2016-09-17 DIAGNOSIS — M67911 Unspecified disorder of synovium and tendon, right shoulder: Secondary | ICD-10-CM | POA: Diagnosis not present

## 2016-11-03 DIAGNOSIS — H5212 Myopia, left eye: Secondary | ICD-10-CM | POA: Diagnosis not present

## 2016-11-03 DIAGNOSIS — H52202 Unspecified astigmatism, left eye: Secondary | ICD-10-CM | POA: Diagnosis not present

## 2016-11-03 DIAGNOSIS — H524 Presbyopia: Secondary | ICD-10-CM | POA: Diagnosis not present

## 2016-11-03 DIAGNOSIS — H2513 Age-related nuclear cataract, bilateral: Secondary | ICD-10-CM | POA: Diagnosis not present

## 2017-03-08 ENCOUNTER — Other Ambulatory Visit (HOSPITAL_COMMUNITY): Payer: Self-pay | Admitting: Internal Medicine

## 2017-03-08 DIAGNOSIS — Z1231 Encounter for screening mammogram for malignant neoplasm of breast: Secondary | ICD-10-CM

## 2017-03-16 DIAGNOSIS — K219 Gastro-esophageal reflux disease without esophagitis: Secondary | ICD-10-CM | POA: Diagnosis not present

## 2017-03-16 DIAGNOSIS — G2581 Restless legs syndrome: Secondary | ICD-10-CM | POA: Diagnosis not present

## 2017-03-16 DIAGNOSIS — Z79899 Other long term (current) drug therapy: Secondary | ICD-10-CM | POA: Diagnosis not present

## 2017-03-16 DIAGNOSIS — G4700 Insomnia, unspecified: Secondary | ICD-10-CM | POA: Diagnosis not present

## 2017-03-16 DIAGNOSIS — E039 Hypothyroidism, unspecified: Secondary | ICD-10-CM | POA: Diagnosis not present

## 2017-03-18 ENCOUNTER — Ambulatory Visit (HOSPITAL_COMMUNITY)
Admission: RE | Admit: 2017-03-18 | Discharge: 2017-03-18 | Disposition: A | Discharge status: Home / Self Care | Payer: Medicare PPO | Source: Ambulatory Visit | Attending: Internal Medicine | Admitting: Internal Medicine

## 2017-03-18 DIAGNOSIS — M7061 Trochanteric bursitis, right hip: Secondary | ICD-10-CM | POA: Diagnosis not present

## 2017-03-18 DIAGNOSIS — Z1231 Encounter for screening mammogram for malignant neoplasm of breast: Secondary | ICD-10-CM | POA: Diagnosis not present

## 2017-03-18 DIAGNOSIS — M67911 Unspecified disorder of synovium and tendon, right shoulder: Secondary | ICD-10-CM | POA: Diagnosis not present

## 2017-03-18 DIAGNOSIS — M25551 Pain in right hip: Secondary | ICD-10-CM | POA: Diagnosis not present

## 2017-04-06 DIAGNOSIS — E039 Hypothyroidism, unspecified: Secondary | ICD-10-CM | POA: Diagnosis not present

## 2017-04-06 DIAGNOSIS — Z6825 Body mass index (BMI) 25.0-25.9, adult: Secondary | ICD-10-CM | POA: Diagnosis not present

## 2017-04-06 DIAGNOSIS — Z0001 Encounter for general adult medical examination with abnormal findings: Secondary | ICD-10-CM | POA: Diagnosis not present

## 2017-04-06 DIAGNOSIS — Z23 Encounter for immunization: Secondary | ICD-10-CM | POA: Diagnosis not present

## 2017-04-17 DIAGNOSIS — M24511 Contracture, right shoulder: Secondary | ICD-10-CM | POA: Diagnosis not present

## 2017-04-22 DIAGNOSIS — M25551 Pain in right hip: Secondary | ICD-10-CM | POA: Diagnosis not present

## 2017-04-22 DIAGNOSIS — M75111 Incomplete rotator cuff tear or rupture of right shoulder, not specified as traumatic: Secondary | ICD-10-CM | POA: Diagnosis not present

## 2017-04-30 DIAGNOSIS — M75111 Incomplete rotator cuff tear or rupture of right shoulder, not specified as traumatic: Secondary | ICD-10-CM | POA: Diagnosis not present

## 2017-04-30 DIAGNOSIS — M25511 Pain in right shoulder: Secondary | ICD-10-CM | POA: Diagnosis not present

## 2017-04-30 DIAGNOSIS — M7061 Trochanteric bursitis, right hip: Secondary | ICD-10-CM | POA: Diagnosis not present

## 2017-05-14 DIAGNOSIS — M7061 Trochanteric bursitis, right hip: Secondary | ICD-10-CM | POA: Diagnosis not present

## 2017-05-14 DIAGNOSIS — M75111 Incomplete rotator cuff tear or rupture of right shoulder, not specified as traumatic: Secondary | ICD-10-CM | POA: Diagnosis not present

## 2017-05-14 DIAGNOSIS — M25511 Pain in right shoulder: Secondary | ICD-10-CM | POA: Diagnosis not present

## 2017-06-03 DIAGNOSIS — C44311 Basal cell carcinoma of skin of nose: Secondary | ICD-10-CM | POA: Diagnosis not present

## 2017-06-03 DIAGNOSIS — D485 Neoplasm of uncertain behavior of skin: Secondary | ICD-10-CM | POA: Diagnosis not present

## 2017-06-03 DIAGNOSIS — C44519 Basal cell carcinoma of skin of other part of trunk: Secondary | ICD-10-CM | POA: Diagnosis not present

## 2017-06-03 DIAGNOSIS — L821 Other seborrheic keratosis: Secondary | ICD-10-CM | POA: Diagnosis not present

## 2017-06-03 DIAGNOSIS — L309 Dermatitis, unspecified: Secondary | ICD-10-CM | POA: Diagnosis not present

## 2017-06-25 DIAGNOSIS — E663 Overweight: Secondary | ICD-10-CM | POA: Diagnosis not present

## 2017-06-25 DIAGNOSIS — H269 Unspecified cataract: Secondary | ICD-10-CM | POA: Diagnosis not present

## 2017-06-25 DIAGNOSIS — M19042 Primary osteoarthritis, left hand: Secondary | ICD-10-CM | POA: Diagnosis not present

## 2017-06-25 DIAGNOSIS — B0229 Other postherpetic nervous system involvement: Secondary | ICD-10-CM | POA: Diagnosis not present

## 2017-06-25 DIAGNOSIS — E039 Hypothyroidism, unspecified: Secondary | ICD-10-CM | POA: Diagnosis not present

## 2017-06-25 DIAGNOSIS — Z6825 Body mass index (BMI) 25.0-25.9, adult: Secondary | ICD-10-CM | POA: Diagnosis not present

## 2017-06-25 DIAGNOSIS — H9190 Unspecified hearing loss, unspecified ear: Secondary | ICD-10-CM | POA: Diagnosis not present

## 2017-06-25 DIAGNOSIS — M19041 Primary osteoarthritis, right hand: Secondary | ICD-10-CM | POA: Diagnosis not present

## 2017-06-25 DIAGNOSIS — G2581 Restless legs syndrome: Secondary | ICD-10-CM | POA: Diagnosis not present

## 2018-01-25 ENCOUNTER — Encounter: Payer: Self-pay | Admitting: Gastroenterology

## 2018-02-22 DIAGNOSIS — Z6825 Body mass index (BMI) 25.0-25.9, adult: Secondary | ICD-10-CM | POA: Diagnosis not present

## 2018-02-22 DIAGNOSIS — K219 Gastro-esophageal reflux disease without esophagitis: Secondary | ICD-10-CM | POA: Diagnosis not present

## 2018-02-22 DIAGNOSIS — E039 Hypothyroidism, unspecified: Secondary | ICD-10-CM | POA: Diagnosis not present

## 2018-02-22 DIAGNOSIS — H9193 Unspecified hearing loss, bilateral: Secondary | ICD-10-CM | POA: Diagnosis not present

## 2018-02-22 DIAGNOSIS — G4700 Insomnia, unspecified: Secondary | ICD-10-CM | POA: Diagnosis not present

## 2018-02-22 DIAGNOSIS — Z9049 Acquired absence of other specified parts of digestive tract: Secondary | ICD-10-CM | POA: Diagnosis not present

## 2018-02-22 DIAGNOSIS — R252 Cramp and spasm: Secondary | ICD-10-CM | POA: Diagnosis not present

## 2018-02-22 DIAGNOSIS — G2581 Restless legs syndrome: Secondary | ICD-10-CM | POA: Diagnosis not present

## 2018-02-22 DIAGNOSIS — M158 Other polyosteoarthritis: Secondary | ICD-10-CM | POA: Diagnosis not present

## 2018-05-09 DIAGNOSIS — E039 Hypothyroidism, unspecified: Secondary | ICD-10-CM | POA: Diagnosis not present

## 2018-05-09 DIAGNOSIS — G4700 Insomnia, unspecified: Secondary | ICD-10-CM | POA: Diagnosis not present

## 2018-05-09 DIAGNOSIS — Z79899 Other long term (current) drug therapy: Secondary | ICD-10-CM | POA: Diagnosis not present

## 2018-05-09 DIAGNOSIS — K219 Gastro-esophageal reflux disease without esophagitis: Secondary | ICD-10-CM | POA: Diagnosis not present

## 2018-05-09 DIAGNOSIS — Z8612 Personal history of poliomyelitis: Secondary | ICD-10-CM | POA: Diagnosis not present

## 2018-05-09 DIAGNOSIS — R7301 Impaired fasting glucose: Secondary | ICD-10-CM | POA: Diagnosis not present

## 2018-05-09 DIAGNOSIS — G2581 Restless legs syndrome: Secondary | ICD-10-CM | POA: Diagnosis not present

## 2018-05-10 ENCOUNTER — Other Ambulatory Visit (HOSPITAL_COMMUNITY): Payer: Self-pay | Admitting: Internal Medicine

## 2018-05-10 DIAGNOSIS — Z1231 Encounter for screening mammogram for malignant neoplasm of breast: Secondary | ICD-10-CM

## 2018-05-12 ENCOUNTER — Ambulatory Visit (HOSPITAL_COMMUNITY)
Admission: RE | Admit: 2018-05-12 | Discharge: 2018-05-12 | Disposition: A | Discharge status: Home / Self Care | Payer: Medicare PPO | Source: Ambulatory Visit | Attending: Internal Medicine | Admitting: Internal Medicine

## 2018-05-12 ENCOUNTER — Encounter (HOSPITAL_COMMUNITY): Payer: Self-pay

## 2018-05-12 DIAGNOSIS — Z1231 Encounter for screening mammogram for malignant neoplasm of breast: Secondary | ICD-10-CM | POA: Insufficient documentation

## 2018-05-16 DIAGNOSIS — E039 Hypothyroidism, unspecified: Secondary | ICD-10-CM | POA: Diagnosis not present

## 2018-05-16 DIAGNOSIS — Z0001 Encounter for general adult medical examination with abnormal findings: Secondary | ICD-10-CM | POA: Diagnosis not present

## 2018-05-16 DIAGNOSIS — Z6825 Body mass index (BMI) 25.0-25.9, adult: Secondary | ICD-10-CM | POA: Diagnosis not present

## 2018-05-16 DIAGNOSIS — R7301 Impaired fasting glucose: Secondary | ICD-10-CM | POA: Diagnosis not present

## 2018-05-23 ENCOUNTER — Ambulatory Visit (INDEPENDENT_AMBULATORY_CARE_PROVIDER_SITE_OTHER): Payer: Medicare PPO

## 2018-05-23 ENCOUNTER — Ambulatory Visit: Payer: Medicare PPO | Admitting: Podiatry

## 2018-05-23 ENCOUNTER — Encounter: Payer: Self-pay | Admitting: Podiatry

## 2018-05-23 VITALS — BP 118/69 | HR 78 | Resp 16

## 2018-05-23 DIAGNOSIS — M2041 Other hammer toe(s) (acquired), right foot: Secondary | ICD-10-CM

## 2018-05-23 DIAGNOSIS — L84 Corns and callosities: Secondary | ICD-10-CM | POA: Diagnosis not present

## 2018-05-23 DIAGNOSIS — M2042 Other hammer toe(s) (acquired), left foot: Secondary | ICD-10-CM | POA: Diagnosis not present

## 2018-05-23 NOTE — Patient Instructions (Signed)
Hammer Toe Hammer toe is a change in the shape (a deformity) of your second, third, or fourth toe. The deformity causes the middle joint of your toe to stay bent. This causes pain, especially when you are wearing shoes. Hammer toe starts gradually. At first, the toe can be straightened. Gradually over time, the deformity becomes stiff and permanent. Early treatments to keep the toe straight may relieve pain. As the deformity becomes stiff and permanent, surgery may be needed to straighten the toe. What are the causes? Hammer toe is caused by abnormal bending of the toe joint that is closest to your foot. It happens gradually over time. This pulls on the muscles and connections (tendons) of the toe joint, making them weak and stiff. It is often related to wearing shoes that are too short or narrow and do not let your toes straighten. What increases the risk? You may be at greater risk for hammer toe if you:  Are female.  Are older.  Wear shoes that are too small.  Wear high-heeled shoes that pinch your toes.  Are a ballet dancer.  Have a second toe that is longer than your big toe (first toe).  Injure your foot or toe.  Have arthritis.  Have a family history of hammer toe.  Have a nerve or muscle disorder.  What are the signs or symptoms? The main symptoms of this condition are pain and deformity of the toe. The pain is worse when wearing shoes, walking, or running. Other symptoms may include:  Corns or calluses over the bent part of the toe or between the toes.  Redness and a burning feeling on the toe.  An open sore that forms on the top of the toe.  Not being able to straighten the toe.  How is this diagnosed? This condition is diagnosed based on your symptoms and a physical exam. During the exam, your health care provider will try to straighten your toe to see how stiff the deformity is. You may also have tests, such as:  A blood test to check for rheumatoid  arthritis.  An X-ray to show how severe the deformity is.  How is this treated? Treatment for this condition will depend on how stiff the deformity is. Surgery is often needed. However, sometimes a hammer toe can be straightened without surgery. Treatments that do not involve surgery include:  Taping the toe into a straightened position.  Using pads and cushions to protect the toe (orthotics).  Wearing shoes that provide enough room for the toes.  Doing toe-stretching exercises at home.  Taking an NSAID to reduce pain and swelling.  If these treatments do not help or the toe cannot be straightened, surgery is the next option. The most common surgeries used to straighten a hammer toe include:  Arthroplasty. In this procedure, part of the joint is removed, and that allows the toe to straighten.  Fusion. In this procedure, cartilage between the two bones of the joint is taken out and the bones are fused together into one longer bone.  Implantation. In this procedure, part of the bone is removed and replaced with an implant to let the toe move again.  Flexor tendon transfer. In this procedure, the tendons that curl the toes down (flexor tendons) are repositioned.  Follow these instructions at home:  Take over-the-counter and prescription medicines only as told by your health care provider.  Do toe straightening and stretching exercises as told by your health care provider.  Keep all   follow-up visits as told by your health care provider. This is important. How is this prevented?  Wear shoes that give your toes enough room and do not cause pain.  Do not wear high-heeled shoes. Contact a health care provider if:  Your pain gets worse.  Your toe becomes red or swollen.  You develop an open sore on your toe. This information is not intended to replace advice given to you by your health care provider. Make sure you discuss any questions you have with your health care  provider. Document Released: 10/16/2000 Document Revised: 05/08/2016 Document Reviewed: 02/12/2016 Elsevier Interactive Patient Education  2018 Elsevier Inc.  

## 2018-05-23 NOTE — Progress Notes (Signed)
   Subjective:    Patient ID: Sylvia Turner, female    DOB: Jan 13, 1936, 82 y.o.   MRN: [id number]  HPI    Review of Systems  All other systems reviewed and are negative.      Objective:   Physical Exam        Assessment & Plan:

## 2018-05-23 NOTE — Progress Notes (Signed)
Subjective:   Patient ID: Sylvia Turner, female   DOB: 82 y.o.   MRN: [id number]   HPI Patient presents stating that she has a painful corn third digit right second left and she has significant hammertoe deformity both feet with history of polio on the left side.  Patient does not smoke likes to be active   Review of Systems  All other systems reviewed and are negative.       Objective:  Physical Exam  Constitutional: She appears well-developed and well-nourished.  Cardiovascular: Intact distal pulses.  Pulmonary/Chest: Effort normal.  Musculoskeletal: Normal range of motion.  Neurological: She is alert.  Skin: Skin is warm.  Nursing note and vitals reviewed.   Neurovascular status intact muscle strength adequate range of motion within normal limits with patient found to have significant rigid contracture of lesser digits right and left foot with lesion third right second left that are keratotic painful when palpated.  Patient does have diminished muscle strength left secondary to polio and does have an gait that is not normal.  Good digital perfusion well oriented x3     Assessment:  Significant rigid hammertoe deformity bilateral with distal keratotic lesion digit to right digit 3     Plan:  H&P x-rays reviewed conditions discussed.  Sterile debridement accomplished with no iatrogenic bleeding and applied buttress pads to take the pressure off the underlying digits.  May require digital fusion if symptoms were to persist which I educated her on today  X-ray indicates that there is significant rigid contracture lesser digits bilateral

## 2018-12-05 DIAGNOSIS — L821 Other seborrheic keratosis: Secondary | ICD-10-CM | POA: Diagnosis not present

## 2018-12-05 DIAGNOSIS — L400 Psoriasis vulgaris: Secondary | ICD-10-CM | POA: Diagnosis not present

## 2018-12-05 DIAGNOSIS — Z85828 Personal history of other malignant neoplasm of skin: Secondary | ICD-10-CM | POA: Diagnosis not present

## 2018-12-20 DIAGNOSIS — Z23 Encounter for immunization: Secondary | ICD-10-CM | POA: Diagnosis not present

## 2019-05-17 ENCOUNTER — Other Ambulatory Visit (HOSPITAL_COMMUNITY): Payer: Self-pay | Admitting: Internal Medicine

## 2019-05-17 DIAGNOSIS — Z1231 Encounter for screening mammogram for malignant neoplasm of breast: Secondary | ICD-10-CM

## 2019-05-30 ENCOUNTER — Ambulatory Visit (HOSPITAL_COMMUNITY): Payer: Medicare PPO

## 2019-05-31 ENCOUNTER — Inpatient Hospital Stay (HOSPITAL_COMMUNITY): Admission: RE | Admit: 2019-05-31 | Payer: Medicare PPO | Source: Ambulatory Visit

## 2019-06-02 ENCOUNTER — Ambulatory Visit (HOSPITAL_COMMUNITY): Payer: Medicare PPO

## 2019-06-06 DIAGNOSIS — E039 Hypothyroidism, unspecified: Secondary | ICD-10-CM | POA: Diagnosis not present

## 2019-06-06 DIAGNOSIS — G4700 Insomnia, unspecified: Secondary | ICD-10-CM | POA: Diagnosis not present

## 2019-06-06 DIAGNOSIS — G2581 Restless legs syndrome: Secondary | ICD-10-CM | POA: Diagnosis not present

## 2019-06-06 DIAGNOSIS — K219 Gastro-esophageal reflux disease without esophagitis: Secondary | ICD-10-CM | POA: Diagnosis not present

## 2019-06-06 DIAGNOSIS — R7301 Impaired fasting glucose: Secondary | ICD-10-CM | POA: Diagnosis not present

## 2019-06-06 DIAGNOSIS — Z79899 Other long term (current) drug therapy: Secondary | ICD-10-CM | POA: Diagnosis not present

## 2019-06-13 DIAGNOSIS — E039 Hypothyroidism, unspecified: Secondary | ICD-10-CM | POA: Diagnosis not present

## 2019-06-13 DIAGNOSIS — G2581 Restless legs syndrome: Secondary | ICD-10-CM | POA: Diagnosis not present

## 2019-06-13 DIAGNOSIS — N390 Urinary tract infection, site not specified: Secondary | ICD-10-CM | POA: Diagnosis not present

## 2019-08-15 DIAGNOSIS — G2581 Restless legs syndrome: Secondary | ICD-10-CM | POA: Diagnosis not present

## 2019-08-15 DIAGNOSIS — E039 Hypothyroidism, unspecified: Secondary | ICD-10-CM | POA: Diagnosis not present

## 2019-08-15 DIAGNOSIS — G4700 Insomnia, unspecified: Secondary | ICD-10-CM | POA: Diagnosis not present

## 2019-08-15 DIAGNOSIS — Z6824 Body mass index (BMI) 24.0-24.9, adult: Secondary | ICD-10-CM | POA: Diagnosis not present

## 2019-08-15 DIAGNOSIS — B0229 Other postherpetic nervous system involvement: Secondary | ICD-10-CM | POA: Diagnosis not present

## 2019-08-15 DIAGNOSIS — K219 Gastro-esophageal reflux disease without esophagitis: Secondary | ICD-10-CM | POA: Diagnosis not present

## 2019-08-15 DIAGNOSIS — I739 Peripheral vascular disease, unspecified: Secondary | ICD-10-CM | POA: Diagnosis not present

## 2019-10-31 ENCOUNTER — Ambulatory Visit: Payer: Medicare PPO | Attending: Internal Medicine

## 2019-10-31 ENCOUNTER — Other Ambulatory Visit: Payer: Self-pay

## 2019-10-31 DIAGNOSIS — Z20822 Contact with and (suspected) exposure to covid-19: Secondary | ICD-10-CM

## 2019-10-31 DIAGNOSIS — Z20828 Contact with and (suspected) exposure to other viral communicable diseases: Secondary | ICD-10-CM | POA: Diagnosis not present

## 2019-11-01 LAB — NOVEL CORONAVIRUS, NAA: SARS-CoV-2, NAA: DETECTED — AB

## 2019-11-02 ENCOUNTER — Telehealth: Payer: Self-pay | Admitting: Infectious Diseases

## 2019-11-02 ENCOUNTER — Encounter: Payer: Self-pay | Admitting: *Deleted

## 2019-11-02 NOTE — Telephone Encounter (Signed)
Called to discuss with patient about Covid symptoms and the use of bamlanivimab, a monoclonal antibody infusion for those with mild to moderate Covid symptoms and at a high risk of hospitalization.  Pt is qualified for this infusion at the [facility] infusion center due to Age > 65   Message left to call back. MyChart also sent.

## 2019-11-13 ENCOUNTER — Encounter (HOSPITAL_COMMUNITY): Payer: Self-pay | Admitting: Emergency Medicine

## 2019-11-13 ENCOUNTER — Emergency Department (HOSPITAL_COMMUNITY): Payer: Medicare PPO

## 2019-11-13 ENCOUNTER — Other Ambulatory Visit: Payer: Self-pay

## 2019-11-13 ENCOUNTER — Inpatient Hospital Stay (HOSPITAL_COMMUNITY)
Admission: EM | Admit: 2019-11-13 | Discharge: 2019-11-18 | DRG: 177 | Disposition: A | Discharge status: Home Health | Payer: Medicare PPO | Attending: Internal Medicine | Admitting: Internal Medicine

## 2019-11-13 DIAGNOSIS — Z209 Contact with and (suspected) exposure to unspecified communicable disease: Secondary | ICD-10-CM | POA: Diagnosis not present

## 2019-11-13 DIAGNOSIS — I361 Nonrheumatic tricuspid (valve) insufficiency: Secondary | ICD-10-CM | POA: Diagnosis not present

## 2019-11-13 DIAGNOSIS — E875 Hyperkalemia: Secondary | ICD-10-CM | POA: Diagnosis present

## 2019-11-13 DIAGNOSIS — Z79899 Other long term (current) drug therapy: Secondary | ICD-10-CM | POA: Diagnosis not present

## 2019-11-13 DIAGNOSIS — J069 Acute upper respiratory infection, unspecified: Secondary | ICD-10-CM | POA: Diagnosis present

## 2019-11-13 DIAGNOSIS — R05 Cough: Secondary | ICD-10-CM | POA: Diagnosis not present

## 2019-11-13 DIAGNOSIS — J440 Chronic obstructive pulmonary disease with acute lower respiratory infection: Secondary | ICD-10-CM | POA: Diagnosis not present

## 2019-11-13 DIAGNOSIS — R0902 Hypoxemia: Secondary | ICD-10-CM | POA: Diagnosis not present

## 2019-11-13 DIAGNOSIS — E8809 Other disorders of plasma-protein metabolism, not elsewhere classified: Secondary | ICD-10-CM | POA: Diagnosis present

## 2019-11-13 DIAGNOSIS — E039 Hypothyroidism, unspecified: Secondary | ICD-10-CM | POA: Diagnosis present

## 2019-11-13 DIAGNOSIS — F419 Anxiety disorder, unspecified: Secondary | ICD-10-CM | POA: Diagnosis present

## 2019-11-13 DIAGNOSIS — Z79890 Hormone replacement therapy (postmenopausal): Secondary | ICD-10-CM

## 2019-11-13 DIAGNOSIS — A419 Sepsis, unspecified organism: Secondary | ICD-10-CM

## 2019-11-13 DIAGNOSIS — U071 COVID-19: Secondary | ICD-10-CM | POA: Diagnosis present

## 2019-11-13 DIAGNOSIS — R6520 Severe sepsis without septic shock: Secondary | ICD-10-CM | POA: Diagnosis not present

## 2019-11-13 DIAGNOSIS — J1282 Pneumonia due to coronavirus disease 2019: Secondary | ICD-10-CM | POA: Diagnosis present

## 2019-11-13 DIAGNOSIS — Z87891 Personal history of nicotine dependence: Secondary | ICD-10-CM

## 2019-11-13 DIAGNOSIS — B0229 Other postherpetic nervous system involvement: Secondary | ICD-10-CM | POA: Diagnosis not present

## 2019-11-13 DIAGNOSIS — I248 Other forms of acute ischemic heart disease: Secondary | ICD-10-CM | POA: Diagnosis not present

## 2019-11-13 DIAGNOSIS — I5031 Acute diastolic (congestive) heart failure: Secondary | ICD-10-CM | POA: Diagnosis present

## 2019-11-13 DIAGNOSIS — R0602 Shortness of breath: Secondary | ICD-10-CM | POA: Diagnosis not present

## 2019-11-13 DIAGNOSIS — B001 Herpesviral vesicular dermatitis: Secondary | ICD-10-CM | POA: Diagnosis present

## 2019-11-13 DIAGNOSIS — G4700 Insomnia, unspecified: Secondary | ICD-10-CM | POA: Diagnosis present

## 2019-11-13 DIAGNOSIS — H9192 Unspecified hearing loss, left ear: Secondary | ICD-10-CM | POA: Diagnosis present

## 2019-11-13 DIAGNOSIS — J9601 Acute respiratory failure with hypoxia: Secondary | ICD-10-CM

## 2019-11-13 DIAGNOSIS — I10 Essential (primary) hypertension: Secondary | ICD-10-CM | POA: Diagnosis not present

## 2019-11-13 DIAGNOSIS — G2581 Restless legs syndrome: Secondary | ICD-10-CM | POA: Diagnosis not present

## 2019-11-13 DIAGNOSIS — T380X5A Adverse effect of glucocorticoids and synthetic analogues, initial encounter: Secondary | ICD-10-CM | POA: Diagnosis not present

## 2019-11-13 DIAGNOSIS — R739 Hyperglycemia, unspecified: Secondary | ICD-10-CM | POA: Diagnosis not present

## 2019-11-13 LAB — CBC WITH DIFFERENTIAL/PLATELET
Band Neutrophils: 4 %
Basophils Absolute: 0 10*3/uL (ref 0.0–0.1)
Basophils Relative: 0 %
Eosinophils Absolute: 0.2 10*3/uL (ref 0.0–0.5)
Eosinophils Relative: 1 %
HCT: 42 % (ref 36.0–46.0)
Hemoglobin: 13.8 g/dL (ref 12.0–15.0)
Lymphocytes Relative: 11 %
Lymphs Abs: 2.1 10*3/uL (ref 0.7–4.0)
MCH: 29.2 pg (ref 26.0–34.0)
MCHC: 32.9 g/dL (ref 30.0–36.0)
MCV: 88.8 fL (ref 80.0–100.0)
Metamyelocytes Relative: 2 %
Monocytes Absolute: 3.4 10*3/uL — ABNORMAL HIGH (ref 0.1–1.0)
Monocytes Relative: 18 %
Myelocytes: 1 %
Neutro Abs: 12.8 10*3/uL — ABNORMAL HIGH (ref 1.7–7.7)
Neutrophils Relative %: 63 %
Platelets: 249 10*3/uL (ref 150–400)
RBC: 4.73 MIL/uL (ref 3.87–5.11)
RDW: 14.3 % (ref 11.5–15.5)
WBC: 19.1 10*3/uL — ABNORMAL HIGH (ref 4.0–10.5)
nRBC: 0 % (ref 0.0–0.2)

## 2019-11-13 LAB — COMPREHENSIVE METABOLIC PANEL
ALT: 36 U/L (ref 0–44)
AST: 64 U/L — ABNORMAL HIGH (ref 15–41)
Albumin: 2.6 g/dL — ABNORMAL LOW (ref 3.5–5.0)
Alkaline Phosphatase: 88 U/L (ref 38–126)
Anion gap: 9 (ref 5–15)
BUN: 11 mg/dL (ref 8–23)
CO2: 25 mmol/L (ref 22–32)
Calcium: 7.8 mg/dL — ABNORMAL LOW (ref 8.9–10.3)
Chloride: 101 mmol/L (ref 98–111)
Creatinine, Ser: 0.6 mg/dL (ref 0.44–1.00)
GFR calc Af Amer: >60 mL/min (ref >60)
GFR calc non Af Amer: >60 mL/min (ref >60)
Glucose, Bld: 104 mg/dL — ABNORMAL HIGH (ref 70–99)
Potassium: 5.4 mmol/L — ABNORMAL HIGH (ref 3.5–5.1)
Sodium: 135 mmol/L (ref 135–145)
Total Bilirubin: 1.8 mg/dL — ABNORMAL HIGH (ref 0.3–1.2)
Total Protein: 6.3 g/dL — ABNORMAL LOW (ref 6.5–8.1)

## 2019-11-13 LAB — LACTATE DEHYDROGENASE: LDH: 567 U/L — ABNORMAL HIGH (ref 98–192)

## 2019-11-13 LAB — C-REACTIVE PROTEIN: CRP: 28 mg/dL — ABNORMAL HIGH (ref <1.0)

## 2019-11-13 LAB — BRAIN NATRIURETIC PEPTIDE: B Natriuretic Peptide: 121 pg/mL — ABNORMAL HIGH (ref 0.0–100.0)

## 2019-11-13 LAB — D-DIMER, QUANTITATIVE: D-Dimer, Quant: 15.26 ug/mL-FEU — ABNORMAL HIGH (ref 0.00–0.50)

## 2019-11-13 LAB — PROCALCITONIN: Procalcitonin: 0.27 ng/mL

## 2019-11-13 LAB — FIBRINOGEN: Fibrinogen: 668 mg/dL — ABNORMAL HIGH (ref 210–475)

## 2019-11-13 LAB — LACTIC ACID, PLASMA: Lactic Acid, Venous: 0.6 mmol/L (ref 0.5–1.9)

## 2019-11-13 LAB — CBG MONITORING, ED: Glucose-Capillary: 93 mg/dL (ref 70–99)

## 2019-11-13 LAB — TRIGLYCERIDES: Triglycerides: 109 mg/dL (ref <150)

## 2019-11-13 LAB — TROPONIN I (HIGH SENSITIVITY)
Troponin I (High Sensitivity): 229 ng/L (ref <18)
Troponin I (High Sensitivity): 386 ng/L (ref <18)

## 2019-11-13 LAB — FERRITIN: Ferritin: 413 ng/mL — ABNORMAL HIGH (ref 11–307)

## 2019-11-13 MED ORDER — ACETAMINOPHEN 325 MG PO TABS: 650.0000 mg | ORAL_TABLET | Freq: Four times a day (QID) | ORAL | Status: DC | PRN

## 2019-11-13 MED ORDER — LEVOTHYROXINE SODIUM 50 MCG PO TABS
100.0000 ug | ORAL_TABLET | Freq: Every day | ORAL | Status: DC
  Administered 2019-11-14 – 2019-11-15 (×2): 100 ug via ORAL
  Filled 2019-11-13 (×2): qty 2

## 2019-11-13 MED ORDER — ENOXAPARIN SODIUM 40 MG/0.4ML ~~LOC~~ SOLN
40.0000 mg | Freq: Two times a day (BID) | SUBCUTANEOUS | Status: DC
  Administered 2019-11-14: 40 mg via SUBCUTANEOUS
  Filled 2019-11-13: qty 0.4

## 2019-11-13 MED ORDER — TOCILIZUMAB 400 MG/20ML IV SOLN
8.0000 mg/kg | Freq: Once | INTRAVENOUS | Status: AC
  Administered 2019-11-13: 16:00:00 490 mg via INTRAVENOUS
  Filled 2019-11-13: qty 24.5

## 2019-11-13 MED ORDER — METHYLPREDNISOLONE SODIUM SUCC 40 MG IJ SOLR
0.5000 mg/kg | Freq: Two times a day (BID) | INTRAMUSCULAR | Status: DC
  Administered 2019-11-13 – 2019-11-14 (×2): 30.8 mg via INTRAVENOUS
  Filled 2019-11-13 (×2): qty 1

## 2019-11-13 MED ORDER — ENOXAPARIN SODIUM 40 MG/0.4ML ~~LOC~~ SOLN
40.0000 mg | SUBCUTANEOUS | Status: DC
  Administered 2019-11-13: 16:00:00 40 mg via SUBCUTANEOUS
  Filled 2019-11-13: qty 0.4

## 2019-11-13 MED ORDER — TRAZODONE HCL 50 MG PO TABS
50.0000 mg | ORAL_TABLET | Freq: Every evening | ORAL | Status: DC | PRN
  Administered 2019-11-15 – 2019-11-17 (×3): 50 mg via ORAL
  Filled 2019-11-13 (×3): qty 1

## 2019-11-13 MED ORDER — SODIUM CHLORIDE 0.9 % IV SOLN
200.0000 mg | Freq: Once | INTRAVENOUS | Status: AC
  Administered 2019-11-13: 200 mg via INTRAVENOUS
  Filled 2019-11-13: qty 40

## 2019-11-13 MED ORDER — ONDANSETRON HCL 4 MG PO TABS: 4.0000 mg | ORAL_TABLET | Freq: Four times a day (QID) | ORAL | Status: DC | PRN

## 2019-11-13 MED ORDER — GUAIFENESIN-DM 100-10 MG/5ML PO SYRP: 10.0000 mL | ORAL_SOLUTION | ORAL | Status: DC | PRN

## 2019-11-13 MED ORDER — SODIUM CHLORIDE 0.9 % IV SOLN
100.0000 mg | Freq: Every day | INTRAVENOUS | Status: AC
  Administered 2019-11-14 – 2019-11-17 (×4): 100 mg via INTRAVENOUS
  Filled 2019-11-13 (×5): qty 20

## 2019-11-13 MED ORDER — ONDANSETRON HCL 4 MG/2ML IJ SOLN: 4.0000 mg | Freq: Four times a day (QID) | INTRAMUSCULAR | Status: DC | PRN

## 2019-11-13 MED ORDER — INSULIN ASPART 100 UNIT/ML ~~LOC~~ SOLN
0.0000 [IU] | SUBCUTANEOUS | Status: DC
  Administered 2019-11-13: 23:00:00 1 [IU] via SUBCUTANEOUS
  Administered 2019-11-14 (×2): 2 [IU] via SUBCUTANEOUS
  Administered 2019-11-14: 3 [IU] via SUBCUTANEOUS
  Administered 2019-11-15: 1 [IU] via SUBCUTANEOUS
  Administered 2019-11-15: 3 [IU] via SUBCUTANEOUS
  Administered 2019-11-15: 20:00:00 5 [IU] via SUBCUTANEOUS
  Administered 2019-11-15: 1 [IU] via SUBCUTANEOUS
  Administered 2019-11-15: 2 [IU] via SUBCUTANEOUS
  Administered 2019-11-16 – 2019-11-17 (×4): 3 [IU] via SUBCUTANEOUS
  Administered 2019-11-17 (×2): 2 [IU] via SUBCUTANEOUS
  Administered 2019-11-18: 1 [IU] via SUBCUTANEOUS
  Administered 2019-11-18: 2 [IU] via SUBCUTANEOUS

## 2019-11-13 MED ORDER — SODIUM CHLORIDE 0.9 % IV BOLUS
500.0000 mL | Freq: Once | INTRAVENOUS | Status: AC
  Administered 2019-11-13: 13:00:00 500 mL via INTRAVENOUS

## 2019-11-13 MED ORDER — ASCORBIC ACID 500 MG PO TABS
500.0000 mg | ORAL_TABLET | Freq: Every day | ORAL | Status: DC
  Administered 2019-11-13 – 2019-11-18 (×6): 500 mg via ORAL
  Filled 2019-11-13 (×6): qty 1

## 2019-11-13 MED ORDER — PRAMIPEXOLE DIHYDROCHLORIDE 0.25 MG PO TABS
0.5000 mg | ORAL_TABLET | Freq: Every evening | ORAL | Status: DC
  Administered 2019-11-15 – 2019-11-17 (×3): 0.5 mg via ORAL
  Filled 2019-11-13 (×7): qty 2

## 2019-11-13 MED ORDER — IPRATROPIUM-ALBUTEROL 20-100 MCG/ACT IN AERS
1.0000 | INHALATION_SPRAY | Freq: Four times a day (QID) | RESPIRATORY_TRACT | Status: DC
  Administered 2019-11-13 – 2019-11-18 (×17): 1 via RESPIRATORY_TRACT
  Filled 2019-11-13 (×2): qty 4

## 2019-11-13 MED ORDER — HYDROCOD POLST-CPM POLST ER 10-8 MG/5ML PO SUER
5.0000 mL | Freq: Two times a day (BID) | ORAL | Status: DC | PRN
  Administered 2019-11-17 – 2019-11-18 (×2): 5 mL via ORAL
  Filled 2019-11-13 (×2): qty 5

## 2019-11-13 MED ORDER — ZINC SULFATE 220 (50 ZN) MG PO CAPS
220.0000 mg | ORAL_CAPSULE | Freq: Every day | ORAL | Status: DC
  Administered 2019-11-13 – 2019-11-18 (×6): 220 mg via ORAL
  Filled 2019-11-13 (×6): qty 1

## 2019-11-13 MED ORDER — SODIUM CHLORIDE 0.9 % IV SOLN: INTRAVENOUS | Status: DC

## 2019-11-13 MED ORDER — INSULIN DETEMIR 100 UNIT/ML ~~LOC~~ SOLN
0.0750 [IU]/kg | Freq: Two times a day (BID) | SUBCUTANEOUS | Status: DC
  Administered 2019-11-14 – 2019-11-18 (×10): 5 [IU] via SUBCUTANEOUS
  Filled 2019-11-13 (×14): qty 0.05

## 2019-11-13 MED ORDER — GABAPENTIN 300 MG PO CAPS
300.0000 mg | ORAL_CAPSULE | Freq: Every day | ORAL | Status: DC
  Administered 2019-11-13 – 2019-11-17 (×5): 300 mg via ORAL
  Filled 2019-11-13 (×5): qty 1

## 2019-11-13 NOTE — ED Notes (Signed)
CRITICAL VALUE ALERT  Critical Value:  Troponin 229  Date & Time Notied:  11/13/19 1436  Provider Notified: Dr Miller/Kate PA  Orders Received/Actions taken:

## 2019-11-13 NOTE — ED Notes (Signed)
Critical trop  Dr Jerilynn Mages informed

## 2019-11-13 NOTE — ED Notes (Signed)
Update to daughter Manuela Schwartz  [phone]

## 2019-11-13 NOTE — ED Notes (Signed)
ble swelling noted.  

## 2019-11-13 NOTE — ED Provider Notes (Signed)
This patient is an 84 year old female, she is ill-appearing, she presents with increasing shortness of breath and generalized weakness.  I spoke to her physician prior to her arrival who stated that she has been getting worse over the last week after being diagnosed with coronavirus.  She has been having increasing weakness, increasing shortness of breath and on my exam has increased work of breathing with tachypnea, shallow breathing and rales diffusely.  Her oxygen is low requiring supplemental oxygen, she is in acute respiratory failure with hypoxia secondary to his coronavirus 19 pneumonia.  I have personally looked at her x-ray which shows diffuse pulmonary bilateral patchy infiltrates consistent with COVID-19.  Her laboratory work-up is also very abnormal with an elevated troponin consider with some type of ischemia.  She will need to be admitted to the hospital, she is critically ill, please see the critical care note documented by physician assistant.  Medical screening examination/treatment/procedure(s) were conducted as a shared visit with non-physician practitioner(s) and myself.  I personally evaluated the patient during the encounter.  Clinical Impression:   Final diagnoses:  [id number] virus infection  Acute respiratory failure with hypoxia ([place])         Noemi Chapel, MD 11/13/19 1752

## 2019-11-13 NOTE — ED Triage Notes (Signed)
Pt arrived from home. Ss of covid started 12/27. Tested positive 12/31. Has been managing at home but sats dropping. Per ems sats were 70 upon therir arrival. 90 with 4L [place] per ems. Arrived a/o. Mild labored breathing noted.

## 2019-11-13 NOTE — ED Notes (Signed)
Pt placed on 6L [place] per vo PA.

## 2019-11-13 NOTE — ED Notes (Signed)
Called [facility] with bed assignmet for Pt going to [facility] (rm [id number]) She is on their list for transport.

## 2019-11-13 NOTE — ED Provider Notes (Signed)
[place] Provider Note   CSN: [id number] Arrival date & time: 11/13/19  1213     History Chief Complaint  Patient presents with  . Shortness of Breath    Sylvia Turner is a 84 y.o. female with past medical history significant for post herpetic neuralgia presenting to emergency department today via EMS with chief complaint of shortness of breath.  Patient's covid symptoms started 10/29/2019 and she tested positive on 10/31/2019.  This is day 16 of illness. Patient states for the last several days she has had increasing shortness of breath with activity.  She is unable to walk from one room to another without having to stop and catch her breath.  She is also endorsing a productive cough with clear and yellow phlegm as well as bilateral lower extremity edema x2 days.  She states this is new for her.  She lives at home with family who has been keeping a close eye on her oxygen saturations the last several days SPO2 has been in the 70s occasionally dropping to the 50s with ambulation.  EMS was called 3 times within the last week however patient declined transport to the emergency department and preferred to stay home.  She has been taking Tylenol and Mucinex with minimal symptom relief.  She reports T-max of 102, unsure when that was exactly.  States she had T-max of 99 yesterday. She also states she recently took Lyrica and gabapentin for post herpetic neuralgia, stating it is bothering her today but feels unchanged from how it usually does. She ahs had decreased PO intake secondary to feeling so poorly. She denies congestion, headache, neck pain, chest pain, abdominal pain, nausea, vomiting, abdominal pain, urinary symptoms, diarrhea.  Patient is not anticoagulated.  Prior to ED arrival patient's PCP Dr. Asencion Noble contacted ED attending Dr. Sabra Heck sharing patient is covid positive and has been hypoxic at home. History provided by patient and EMS with additional history obtained  from chart review.      Past Medical History:  Diagnosis Date  . Deaf L Ear  . Hip problem   . Post herpetic neuralgia     Patient Active Problem List   Diagnosis Date Noted  . Postherpetic neuralgia at T3-T5 level 10/17/2014    Past Surgical History:  Procedure Laterality Date  . CHOLECYSTECTOMY    . EXTERNAL EAR SURGERY       OB History   No obstetric history on file.     Family History  Problem Relation Age of Onset  . Dementia Mother   . Other Father        Black Lung    Social History   Tobacco Use  . Smoking status: Former Smoker    Packs/day: 0.50    Years: 10.00    Pack years: 5.00    Types: Cigarettes    Quit date: 11/02/1988    Years since quitting: 31.0  . Smokeless tobacco: Never Used  Substance Use Topics  . Alcohol use: Yes    Alcohol/week: 0.0 standard drinks    Comment: rarely 1 glass of wine  . Drug use: No    Home Medications Prior to Admission medications   Medication Sig Start Date End Date Taking? Authorizing Provider  Cyanocobalamin (VITAMIN B-12 PO) Take 1 tablet by mouth daily.    [provider]  gabapentin (NEURONTIN) 300 MG capsule Take 300 mg by mouth at bedtime. 09/26/19   [provider]  HYDROcodone-homatropine (HYCODAN) 5-1.5 MG/5ML syrup Take  5 mLs by mouth every 4 (four) hours as needed. 11/06/19   [provider]  levothyroxine (SYNTHROID, LEVOTHROID) 100 MCG tablet Take 100 mcg by mouth daily before breakfast.    [provider]  MAGNESIUM PO Take 1 tablet by mouth daily.    [provider]  pramipexole (MIRAPEX) 0.25 MG tablet Take 2 tablets by mouth daily. 10/10/14   [provider]  pregabalin (LYRICA) 75 MG capsule Take 75 mg by mouth 2 (two) times daily as needed.    [provider]  ranitidine (ZANTAC) 300 MG tablet  05/16/18   [provider]  traZODone (DESYREL) 50 MG tablet Take 1 tablet by mouth at bedtime. 10/11/14   [provider]    TURMERIC PO Take 1 tablet by mouth daily.    [provider]    Allergies    Other  Review of Systems   Review of Systems All other systems are reviewed and are negative for acute change except as noted in the HPI.   Physical Exam Updated Vital Signs BP (!) 153/81 (BP Location: Left Arm)   Pulse (!) 101   Temp 98.3 F (36.8 C) (Oral)   Resp (!) 31   Ht 5\' 2"  (1.575 m)   Wt 61.2 kg   SpO2 90%   BMI 24.69 kg/m   Physical Exam Vitals and nursing note reviewed.  Constitutional:      Appearance: She is ill-appearing and toxic-appearing. She is not diaphoretic.  HENT:     Head: Normocephalic and atraumatic.     Right Ear: Tympanic membrane and external ear normal.     Left Ear: Tympanic membrane and external ear normal.     Nose: Nose normal.     Mouth/Throat:     Mouth: Mucous membranes are dry.     Pharynx: Oropharynx is clear.  Eyes:     General: No scleral icterus.       Right eye: No discharge.        Left eye: No discharge.     Extraocular Movements: Extraocular movements intact.     Conjunctiva/sclera: Conjunctivae normal.     Pupils: Pupils are equal, round, and reactive to light.  Neck:     Vascular: No JVD.  Cardiovascular:     Rate and Rhythm: Normal rate and regular rhythm.     Pulses: Normal pulses.          Radial pulses are 2+ on the right side and 2+ on the left side.     Heart sounds: Normal heart sounds.     Comments: Heart rate during exam to range from 90-99 Pulmonary:     Comments: Lung sounds are diminished throughout.  Patient is tachypneic.  She has increased work of breathing.  She is speaking in short sentences.  SpO2 is 90% on 4 L nasal cannula, while talking she desats to 74%.  SpO2 increased to 6 L with improvement to 93%.  No wheezing heard. Abdominal:     Comments: Abdomen is soft, non-distended, and non-tender in all quadrants. No rigidity, no guarding. No peritoneal signs.  Musculoskeletal:        General: Normal range of  motion.     Cervical back: Normal range of motion.     Right lower leg: 1+ Pitting Edema present.     Left lower leg: 1+ Pitting Edema present.  Skin:    General: Skin is warm and dry.     Capillary Refill: Capillary refill takes  less than 2 seconds.  Neurological:     Mental Status: She is oriented to person, place, and time.     GCS: GCS eye subscore is 4. GCS verbal subscore is 5. GCS motor subscore is 6.     Comments: Fluent speech, no facial droop.  Psychiatric:        Behavior: Behavior normal.       ED Results / Procedures / Treatments   Labs (all labs ordered are listed, but only abnormal results are displayed) Labs Reviewed  CBC WITH DIFFERENTIAL/PLATELET - Abnormal; Notable for the following components:      Result Value   WBC 19.1 (*)    Neutro Abs 12.8 (*)    Monocytes Absolute 3.4 (*)    All other components within normal limits  COMPREHENSIVE METABOLIC PANEL - Abnormal; Notable for the following components:   Potassium 5.4 (*)    Glucose, Bld 104 (*)    Calcium 7.8 (*)    Total Protein 6.3 (*)    Albumin 2.6 (*)    AST 64 (*)    Total Bilirubin 1.8 (*)    All other components within normal limits  D-DIMER, QUANTITATIVE (NOT AT [facility]) - Abnormal; Notable for the following components:   D-Dimer, Quant 15.26 (*)    All other components within normal limits  LACTATE DEHYDROGENASE - Abnormal; Notable for the following components:   LDH 567 (*)    All other components within normal limits  FERRITIN - Abnormal; Notable for the following components:   Ferritin 413 (*)    All other components within normal limits  FIBRINOGEN - Abnormal; Notable for the following components:   Fibrinogen 668 (*)    All other components within normal limits  C-REACTIVE PROTEIN - Abnormal; Notable for the following components:   CRP 28.0 (*)    All other components within normal limits  BRAIN NATRIURETIC PEPTIDE - Abnormal; Notable for the following components:   B Natriuretic  Peptide 121.0 (*)    All other components within normal limits  TROPONIN I (HIGH SENSITIVITY) - Abnormal; Notable for the following components:   Troponin I (High Sensitivity) 229 (*)    All other components within normal limits  CULTURE, BLOOD (ROUTINE X 2)  CULTURE, BLOOD (ROUTINE X 2)  LACTIC ACID, PLASMA  PROCALCITONIN  TRIGLYCERIDES    EKG EKG Interpretation  Date/Time:  Monday November 13 2019 12:45:41 EST Ventricular Rate:  93 PR Interval:    QRS Duration: 67 QT Interval:  436 QTC Calculation: 543 R Axis:   31 Text Interpretation: Sinus rhythm Consider anterior infarct Prolonged QT interval Nonspecific T wave abnormality No old tracing to compare Confirmed by Noemi Chapel [phone]) on 11/13/2019 2:51:16 PM   Radiology DG Chest Port 1 View  Result Date: 11/13/2019 CLINICAL DATA:  Pt c/o worsening SOB. Pt diagnosed with COVID 12/31. Hx former smoker cough, sob, covid + EXAM: PORTABLE CHEST 1 VIEW COMPARISON:  01/17/2008 FINDINGS: Normal cardiac silhouette. There is patchy bilateral peripheral nodular airspace densities. No pleural fluid. No pneumothorax. No acute osseous abnormality. IMPRESSION: Patchy peripheral nodular airspace disease consistent with COVID viral pneumonia. Electronically Signed   By: Suzy Bouchard M.D.   On: 11/13/2019 12:58    Procedures .Critical Care Performed by: Cherre Robins, PA-C Authorized by: Cherre Robins, PA-C   Critical care provider statement:    Critical care time (minutes):  40   Critical care was necessary to treat or prevent imminent or life-threatening deterioration of the  following conditions: acute hypoxia with respiratory failure in setting of covid.   Critical care was time spent personally by me on the following activities:  Blood draw for specimens, development of treatment plan with patient or surrogate, discussions with consultants, evaluation of patient's response to treatment, examination of patient, obtaining  history from patient or surrogate, ordering and performing treatments and interventions, ordering and review of laboratory studies, ordering and review of radiographic studies, pulse oximetry, re-evaluation of patient's condition and review of old charts   I assumed direction of critical care for this patient from another provider in my specialty: no     (including critical care time)  Medications Ordered in ED Medications  sodium chloride 0.9 % bolus 500 mL (0 mLs Intravenous Stopped 11/13/19 1359)    ED Course  I have reviewed the triage vital signs and the nursing notes.  Pertinent labs & imaging results that were available during my care of the patient were reviewed by me and considered in my medical decision making (see chart for details).  Clinical Course as of Nov 13 1451  Mon Nov 13, 2019  1342 Leukocytosis of 19.1 with neutrophil count of 12.8.  No anemia.  CBC WITH DIFFERENTIAL(!) [KA]  1342 CMP shows hyperkalemia of 5.4, hypocalcemia 7.8, hypoalbuminemia 2.6, elevated AST at 64, elevated total bilirubin at 1.8.  Comprehensive metabolic panel(!) [KA]  [id number] Lactic  acid is within normal range.  Lactic acid, plasma [KA]  1345 D-dimer is elevated at 15.26.  D-dimer, quantitative(!) [KA]  1345 LDH elevated at 567.  Lactate dehydrogenase(!) [KA]  1350 BNP elevated at 121, no prior to compare  Brain natriuretic peptide(!) [KA]  1409 CRP elevated at 28  C-reactive protein(!) [KA]  1409 Fibrinogen elevated at 668  Fibrinogen(!): 668 [KA]  1409 Ferritin elevated at 413  Ferritin(!): 413 [KA]  1426 CRP elevated at 28  C-reactive protein(!) [KA]  1436 Troponin elevated at 229  Troponin I (High Sensitivity)(!!) [KA]    Clinical Course User Index [KA] Cherre Robins, PA-C   Vitals:   11/13/19 1345 11/13/19 1400 11/13/19 1415 11/13/19 1430  BP:  (!) 148/127  (!) 163/83  Pulse:      Resp: (!) 32 (!) 38 (!) 44 (!) 35  Temp:      TempSrc:      SpO2:      Weight:        Height:        MDM Rules/Calculators/A&P                      Patient seen and examined. Patient presents awake, alert, hemodynamically stable, afebrile, non toxic however does appear to be in respiratory distress.  She is tachypneic with diminished lung sounds throughout.  She is speaking in short sentences.  Lung sounds are diminished throughout.  On exam she also has bilateral plus lower extremity pitting edema, this is new for her and she denies any cardiac history.  Patient originally on SPO2 4 L however continued to desat to the 70s while speaking, increased to 6 L with improvement to 93%.  Anticipate admission for hypoxia related to Covid and her new oxygen requirement.  Suspect admission order set used. Given small fluid bolus as she does appear dehydrated and has had limited PO intake. EKG viewed by me shows sinus rhythm with prolonged QTC of 543.  Will hold off on QT prolonging medications.  No STEMI. Labs show multisystem organ dysfunction, significant labs findings as  documented in ED course above.  Important findings included leukocytosis, elevated D-dimer, elevated LDH, elevated BNP, hyperkalemia 5.4, hypocalcemia, hypoalbuminemia, elevated AST, elevated total bilirubin.  Troponin is also elevated at 229, no prior to compare to, this could be suggestive of myocarditis given the lower extremity edema or possibly elevated from demand/ischemia.  Will continue to trend. On reassessment she continues to deny chest pain. Chest x-ray viewed by me shows patchy perifollicular nodular airspace consistent with covid pneumonia. This case was discussed with Dr. Sabra Heck who has seen the patient and agrees with plan to admit. Spoke with Dr. Dyann Kief with hospitalist service who agrees patient requires admission however will need higher level of care at [facility].  He will see patient and facilitate transfer to [facility].  Patient is stable at time of disposition.  Diva Mcneff Barba was evaluated  in Emergency Department on 11/13/2019 for the symptoms described in the history of present illness. She was evaluated in the context of the global COVID-19 pandemic, which necessitated consideration that the patient might be at risk for infection with the SARS-CoV-2 virus that causes COVID-19. Institutional protocols and algorithms that pertain to the evaluation of patients at risk for COVID-19 are in a state of rapid change based on information released by regulatory bodies including the CDC and federal and state organizations. These policies and algorithms were followed during the patient's care in the ED.   Portions of this note were generated with [employer]. Dictation errors may occur despite best attempts at proofreading.   Final Clinical Impression(s) / ED Diagnoses Final diagnoses:  COVID-19 virus infection  Acute respiratory failure with hypoxia [facility])    Rx / DC Orders ED Discharge Orders    None       Flint Melter 11/13/19 1710    Noemi Chapel, MD 11/13/19 1753

## 2019-11-13 NOTE — ED Notes (Signed)
AC phone to VM full for [facility]   Unable to get med for pt

## 2019-11-13 NOTE — H&P (Signed)
History and Physical    Sylvia Turner [id number] DOB: 14-Oct-1936 DOA: 11/13/2019  Referring MD/NP/PA: Dr. Noemi Chapel. PCP: Asencion Noble, MD  Patient coming from: Home  Chief Complaint: Shortness of breath, hypoxia, general malaise, anorexia and weakness.  HPI: Sylvia Turner is a 84 y.o. female with past medical history significant for postherpetic neuralgia, COPD (no requiring oxygen and only using as needed albuterol), prior history of tobacco use, hypothyroidism, restless leg syndrome and insomnia; who was diagnosed with coronavirus infection on 11/02/19 I was managing her symptoms at home.  Given recent decompensation based on her age, history of COPD patient was supposed to receive monoclonal antibody as an outpatient, but that never happens.  Patient symptoms continued progressing and worsening and she expressed having significant shortness of breath, dyspnea on exertion, decreased appetite, general malaise.  Patient also reported chills but no frank fevers.  Patient denies chest pain, dysuria, hematuria, nausea, vomiting, abdominal pain, melena, hematochezia, headaches, blurred vision or focal weakness.  In the ED chest x-ray demonstrated patchy peripheral nodular airspace disease consistent with Covid viral pneumonia; patient was found with a oxygen saturation of 84% on room air and required up to 6-7 L initially to maintain O2 sats above 90%.  With physical findings suggesting dehydration (dry lips and decreased skin turgor), respiratory rate in the mid 30s, mild tachycardia, elevated WBCs in the 19,000 range, potassium of 5.4, CRP 28, elevated troponin (228), normal lactic acid, procalcitonin 0.25, LDH 567, ferritin 413 and D-dimer 15.26.  LFTs and patient's creatinine within normal limits.  TR[facility] has been called admit patient for further evaluation and management.  IV fluids initiated in the ED, patient started on steroids and remdesivir.  Past Medical/Surgical History: Past Medical  History:  Diagnosis Date  . Deaf L Ear  . Hip problem   . Post herpetic neuralgia     Past Surgical History:  Procedure Laterality Date  . CHOLECYSTECTOMY    . EXTERNAL EAR SURGERY      Social History:  reports that she quit smoking about 31 years ago. Her smoking use included cigarettes. She has a 5.00 pack-year smoking history. She has never used smokeless tobacco. She reports current alcohol use. She reports that she does not use drugs.  Allergies: Allergies  Allergen Reactions  . Hydrocodone Nausea And Vomiting  . Other     Ex. Perfume smells, candle shop    Family History:  Family History  Problem Relation Age of Onset  . Dementia Mother   . Other Father        Black Lung    Prior to Admission medications   Medication Sig Start Date End Date Taking? Authorizing Provider  Cyanocobalamin (VITAMIN B-12 PO) Take 1 tablet by mouth daily.   Yes [provider]  levothyroxine (SYNTHROID, LEVOTHROID) 100 MCG tablet Take 100 mcg by mouth daily before breakfast.   Yes [provider]  MAGNESIUM PO Take 1 tablet by mouth daily.   Yes [provider]  pramipexole (MIRAPEX) 0.25 MG tablet Take 2 tablets by mouth every evening.  10/10/14  Yes [provider]  traZODone (DESYREL) 50 MG tablet Take 1 tablet by mouth at bedtime. 10/11/14  Yes [provider]  TURMERIC PO Take 1 tablet by mouth daily.   Yes [provider]  gabapentin (NEURONTIN) 300 MG capsule Take 300 mg by mouth at bedtime. 09/26/19   [provider]    Review of Systems:  Negative except as otherwise mentioned in HPI.  Physical Exam: Vitals:   11/13/19 1525 11/13/19 1530 11/13/19 1600 11/13/19 1615  BP:  (!) 159/82 (!) 176/87   Pulse: 91 89  (!) 105  Resp: (!) 39 (!) 43 (!) 38 (!) 34  Temp:      TempSrc:      SpO2: 97% (!) 84%  (!) 89%  Weight:      Height:        Constitutional: Unable to speak in full sentences, short of breath and  with increased respiratory labor on minimal exertion.  Patient requiring 7 L oxygen supplementation to maintain O2 sats above 92%.  Patient denies chest pain, nausea, vomiting or fever currently. Eyes: PERRL, lids and conjunctivae normal, no icterus, no nystagmus. ENMT: Mucous membranes dry mucous membranes. Posterior pharynx clear of any exudate or lesions. No thrush  Neck: normal, supple, no masses, no thyromegaly, no JVD Respiratory: Positive tachypnea, no wheezing, no frank crackles; fair air movement.  Mildly use of accessory muscles when engaging on exertion. Cardiovascular: Regular rate and rhythm, no murmurs / rubs / gallops. Trace lower extremity edema. 2+ pedal pulses. No carotid bruits.  Abdomen: no tenderness, no masses palpated. No hepatosplenomegaly. Bowel sounds positive.  Musculoskeletal: no clubbing / cyanosis. No joint deformity upper and lower extremities. Good ROM, no contractures. Normal muscle tone.  Skin: no rashes, lesions, ulcers. No induration Neurologic: CN 2-12 grossly intact. Sensation intact, DTR normal. Strength 5/5 in all 4.  Psychiatric: Normal judgment and insight. Alert and oriented x 3. Normal mood.    Labs on Admission: I have personally reviewed the following labs and imaging studies  CBC: Recent Labs  Lab 11/13/19 1236  WBC 19.1*  NEUTROABS 12.8*  HGB 13.8  HCT 42.0  MCV 88.8  PLT 0000000   Basic Metabolic Panel: Recent Labs  Lab 11/13/19 1236  NA 135  K 5.4*  CL 101  CO2 25  GLUCOSE 104*  BUN 11  CREATININE 0.60  CALCIUM 7.8*   GFR: Estimated Creatinine Clearance: 47.5 mL/min (by C-G formula based on SCr of 0.6 mg/dL).   Liver Function Tests: Recent Labs  Lab 11/13/19 1236  AST 64*  ALT 36  ALKPHOS 88  BILITOT 1.8*  PROT 6.3*  ALBUMIN 2.6*   CBG: Recent Labs  Lab 11/13/19 1550  GLUCAP 93   Lipid Profile: Recent Labs    11/13/19 1236  TRIG 109   Anemia Panel: Recent Labs    11/13/19 1236  FERRITIN 413*     Recent Results (from the past 240 hour(s))  Blood Culture (routine x 2)     Status: None (Preliminary result)   Collection Time: 11/13/19 12:36 PM   Specimen: BLOOD RIGHT FOREARM  Result Value Ref Range Status   Specimen Description BLOOD RIGHT FOREARM  Final   Special Requests   Final    BOTTLES DRAWN AEROBIC AND ANAEROBIC Blood Culture results may not be optimal due to an inadequate volume of blood received in culture bottles Performed at [facility], [address], [place], [place] [address]    Culture PENDING  Incomplete   Report Status PENDING  Incomplete  Blood Culture (routine x 2)     Status: None (Preliminary result)   Collection Time: 11/13/19 12:41 PM   Specimen: Right Antecubital; Blood  Result Value Ref Range Status   Specimen Description RIGHT ANTECUBITAL  Final   Special Requests   Final    BOTTLES DRAWN AEROBIC AND ANAEROBIC Blood Culture adequate volume Performed at [facility], [address] [place], [place] [address]    Culture PENDING  Incomplete   Report Status PENDING  Incomplete     Radiological Exams on Admission: DG Chest Port 1 View  Result Date: 11/13/2019 CLINICAL DATA:  Pt c/o worsening SOB. Pt diagnosed with COVID 12/31. Hx former smoker cough, sob, covid + EXAM: PORTABLE CHEST 1 VIEW COMPARISON:  01/17/2008 FINDINGS: Normal cardiac silhouette. There is patchy bilateral peripheral nodular airspace densities. No pleural fluid. No pneumothorax. No acute osseous abnormality. IMPRESSION: Patchy peripheral nodular airspace disease consistent with COVID viral pneumonia. Electronically Signed   By: Suzy Bouchard M.D.   On: 11/13/2019 12:58    EKG: Independently reviewed.  Sinus rhythm, normal axis; nonspecific T wave abnormalities appreciated.  No signs of acute ischemia seen.  Assessment/Plan 1-sepsis/acute respiratory disease due to COVID-19 virus -Patient meeting sepsis criteria with acute respiratory distress and hypoxia secondary to COVID-19  infection -Elevated respiratory rate, elevated heart rate, elevated WBCs and hypoxia as organ dysfunction for patient to meet sepsis criteria. -IV fluids given in the ED -Patient with CRP of 28, procalcitonin 0.25, bilateral infiltrates on her chest x-ray and no other source or complaints to lead to bacterial infection. -WBCs 19.1, mild hyperkalemia and concern for dehydration on physical exam. -Patient will be admitted to progressive unit at [facility] for further evaluation and management of COVID-19 infection. -Given severe hypoxia, elevated CRP and high risk for decompensation with underlying history of COPD will treat with Amctera, IV steroids and remdesivir. -Currently requiring 7 L of oxygen supplementation to maintain O2 sats above 92%. -As needed bronchodilators, antipyretics, zinc and vitamin C ordered. -Follow clinical response. -Patient is a full code. -Follow inflammatory markers. -Lovenox q12 hours, given elevated D-dimers. Might need chest CTA base on clinical course. Decrease lovenox to daily dose once level < 5, or CTA neg for PE. Patient denies CP.  2-hypothyroidism -Will check TSH -Continue Synthroid  3-mild hyperkalemia -Potassium of 5.4 -No acute or normalities appreciated on EKG or telemetry -Holding magnesium supplementation at this time -Patient will be initiated on treatment with bronchodilators and also insulin coverage for elevated CBGs with the steroids therapy. -Follow electrolytes trend. -IV fluids will be provided.  4-elevated troponin -Appears to be secondary to demand ischemia in the setting of sepsis from Covid infection. -So far troponin trend 229 >> 386 -Patient denies chest pain -No acute ischemic changes on EKG-we will check 2D echo.  5-hyperglycemia -No prior history of diabetes -Follow CBGs -Will check A1c -insulin therapy as per COVID protocol   6-history of COPD and prior smoking history -Patient no longer smoking -No requiring oxygen  supplementation at baseline and intermittently using as needed albuterol only. -No wheezing at this time. -As needed bronchodilators and steroids as mentioned above.  7-history of postherpetic neuralgia and restless leg syndrome -Continue nightly Neurontin and Mirapex.  8-insomnia -Continue as needed trazodone.   DVT prophylaxis: Lovenox Code Status: Full code Family Communication: No family at bedside. Disposition Plan: To be determined; but hopefully discharge back home once acute respiratory failure with hypoxia stabilized and COVID infection controlled . Consults called: None Admission status: Progressive unit, inpatient status; [employer].   Time Spent: 70 minutes.  Barton Dubois MD [facility] Pager [phone]  11/13/2019, 4:59 PM

## 2019-11-13 NOTE — ED Notes (Signed)
Call to daughter Manuela Schwartz [phone]

## 2019-11-14 ENCOUNTER — Inpatient Hospital Stay (HOSPITAL_COMMUNITY): Payer: Medicare PPO

## 2019-11-14 DIAGNOSIS — I361 Nonrheumatic tricuspid (valve) insufficiency: Secondary | ICD-10-CM

## 2019-11-14 LAB — COMPREHENSIVE METABOLIC PANEL
ALT: 30 U/L (ref 0–44)
AST: 49 U/L — ABNORMAL HIGH (ref 15–41)
Albumin: 2.5 g/dL — ABNORMAL LOW (ref 3.5–5.0)
Alkaline Phosphatase: 92 U/L (ref 38–126)
Anion gap: 15 (ref 5–15)
BUN: 18 mg/dL (ref 8–23)
CO2: 22 mmol/L (ref 22–32)
Calcium: 7.9 mg/dL — ABNORMAL LOW (ref 8.9–10.3)
Chloride: 102 mmol/L (ref 98–111)
Creatinine, Ser: 0.58 mg/dL (ref 0.44–1.00)
GFR calc Af Amer: >60 mL/min (ref >60)
GFR calc non Af Amer: >60 mL/min (ref >60)
Glucose, Bld: 87 mg/dL (ref 70–99)
Potassium: 4.1 mmol/L (ref 3.5–5.1)
Sodium: 139 mmol/L (ref 135–145)
Total Bilirubin: 0.4 mg/dL (ref 0.3–1.2)
Total Protein: 5.9 g/dL — ABNORMAL LOW (ref 6.5–8.1)

## 2019-11-14 LAB — CBC WITH DIFFERENTIAL/PLATELET
Abs Immature Granulocytes: 1.1 10*3/uL — ABNORMAL HIGH (ref 0.00–0.07)
Basophils Absolute: 0.2 10*3/uL — ABNORMAL HIGH (ref 0.0–0.1)
Basophils Relative: 1 %
Eosinophils Absolute: 0 10*3/uL (ref 0.0–0.5)
Eosinophils Relative: 0 %
HCT: 42.5 % (ref 36.0–46.0)
Hemoglobin: 13.6 g/dL (ref 12.0–15.0)
Immature Granulocytes: 7 %
Lymphocytes Relative: 9 %
Lymphs Abs: 1.5 10*3/uL (ref 0.7–4.0)
MCH: 28.8 pg (ref 26.0–34.0)
MCHC: 32 g/dL (ref 30.0–36.0)
MCV: 89.9 fL (ref 80.0–100.0)
Monocytes Absolute: 2.1 10*3/uL — ABNORMAL HIGH (ref 0.1–1.0)
Monocytes Relative: 12 %
Neutro Abs: 12.2 10*3/uL — ABNORMAL HIGH (ref 1.7–7.7)
Neutrophils Relative %: 71 %
Platelets: 279 10*3/uL (ref 150–400)
RBC: 4.73 MIL/uL (ref 3.87–5.11)
RDW: 14.3 % (ref 11.5–15.5)
WBC: 17 10*3/uL — ABNORMAL HIGH (ref 4.0–10.5)
nRBC: 0 % (ref 0.0–0.2)

## 2019-11-14 LAB — PHOSPHORUS: Phosphorus: 2.7 mg/dL (ref 2.5–4.6)

## 2019-11-14 LAB — GLUCOSE, CAPILLARY
Glucose-Capillary: 126 mg/dL — ABNORMAL HIGH (ref 70–99)
Glucose-Capillary: 91 mg/dL (ref 70–99)
Glucose-Capillary: 86 mg/dL (ref 70–99)
Glucose-Capillary: 178 mg/dL — ABNORMAL HIGH (ref 70–99)
Glucose-Capillary: 183 mg/dL — ABNORMAL HIGH (ref 70–99)
Glucose-Capillary: 208 mg/dL — ABNORMAL HIGH (ref 70–99)

## 2019-11-14 LAB — BRAIN NATRIURETIC PEPTIDE: B Natriuretic Peptide: 114.6 pg/mL — ABNORMAL HIGH (ref 0.0–100.0)

## 2019-11-14 LAB — HEMOGLOBIN A1C
Hgb A1c MFr Bld: 6.1 % — ABNORMAL HIGH (ref 4.8–5.6)
Mean Plasma Glucose: 128.37 mg/dL

## 2019-11-14 LAB — ECHOCARDIOGRAM COMPLETE
Height: 62 in
Weight: 2352.75 oz

## 2019-11-14 LAB — D-DIMER, QUANTITATIVE: D-Dimer, Quant: 7.22 ug/mL-FEU — ABNORMAL HIGH (ref 0.00–0.50)

## 2019-11-14 LAB — PROCALCITONIN: Procalcitonin: 0.1 ng/mL

## 2019-11-14 LAB — C-REACTIVE PROTEIN: CRP: 24.4 mg/dL — ABNORMAL HIGH (ref <1.0)

## 2019-11-14 LAB — MAGNESIUM: Magnesium: 1.9 mg/dL (ref 1.7–2.4)

## 2019-11-14 LAB — TSH: TSH: 7.446 u[IU]/mL — ABNORMAL HIGH (ref 0.350–4.500)

## 2019-11-14 LAB — FERRITIN: Ferritin: 375 ng/mL — ABNORMAL HIGH (ref 11–307)

## 2019-11-14 MED ORDER — AMLODIPINE BESYLATE 10 MG PO TABS
10.0000 mg | ORAL_TABLET | Freq: Every day | ORAL | Status: DC
  Administered 2019-11-14 – 2019-11-18 (×5): 10 mg via ORAL
  Filled 2019-11-14 (×5): qty 1

## 2019-11-14 MED ORDER — ENOXAPARIN SODIUM 40 MG/0.4ML ~~LOC~~ SOLN
40.0000 mg | Freq: Two times a day (BID) | SUBCUTANEOUS | Status: DC
  Administered 2019-11-14 – 2019-11-17 (×6): 40 mg via SUBCUTANEOUS
  Filled 2019-11-14 (×6): qty 0.4

## 2019-11-14 MED ORDER — ALPRAZOLAM 0.25 MG PO TABS
0.2500 mg | ORAL_TABLET | Freq: Two times a day (BID) | ORAL | Status: DC | PRN
  Administered 2019-11-14: 12:00:00 0.25 mg via ORAL
  Filled 2019-11-14: qty 1

## 2019-11-14 MED ORDER — METHYLPREDNISOLONE SODIUM SUCC 125 MG IJ SOLR
60.0000 mg | Freq: Every day | INTRAMUSCULAR | Status: DC
  Administered 2019-11-14 – 2019-11-18 (×5): 60 mg via INTRAVENOUS
  Filled 2019-11-14 (×5): qty 2

## 2019-11-14 MED ORDER — ASPIRIN 81 MG PO CHEW
81.0000 mg | CHEWABLE_TABLET | Freq: Every day | ORAL | Status: DC
  Administered 2019-11-14 – 2019-11-18 (×5): 81 mg via ORAL
  Filled 2019-11-14 (×5): qty 1

## 2019-11-14 MED ORDER — FUROSEMIDE 10 MG/ML IJ SOLN
40.0000 mg | Freq: Once | INTRAMUSCULAR | Status: AC
  Administered 2019-11-14: 40 mg via INTRAVENOUS
  Filled 2019-11-14: qty 4

## 2019-11-14 NOTE — Progress Notes (Signed)
Initial Nutrition Assessment  RD working remotely.  DOCUMENTATION CODES:   Not applicable  INTERVENTION:   - Liberalize diet to Regular, verbal with readback order placed per MD  - Ensure Enlive po BID between meals, each supplement provides 350 kcal and 20 grams of protein  - Encourage adequate PO intake  - Pt receiving Ensure Enlive shake daily with breakfast meal which provides 350 kcals and 20 grams of protein. Pt also receiving Magic Cup BID with lunch and dinner meals, each supplement provides 290 kcal and 9 grams of protein, automatically on meal trays to optimize nutritional intake.  NUTRITION DIAGNOSIS:   Increased nutrient needs related to acute illness, catabolic illness ([id number]) as evidenced by estimated needs.  GOAL:   Patient will meet greater than or equal to 90% of their needs  MONITOR:   PO intake, Supplement acceptance, Labs, Weight trends  REASON FOR ASSESSMENT:   Consult Assessment of nutrition requirement/status  ASSESSMENT:   84 year old female who presented to the ED on 1/11 with SOB. PMH of post herpetic neuralgia, COPD. Pt tested positive for COVID-19 on 10/31/19.  Discussed diet liberalization with MD who agreed. Regular diet ordered.  Weight history in chart is limited. Per RN edema assessment, pt with mild pitting edema to BLE which may be falsely elevating weight.  RD will order Ensure Enlive supplements between meals to aid pt in meeting kcal and protein needs during admission.  Meal Completion: 50% x 1 recorded meal  Medications reviewed and include: vitamin C, SSI, Levemir 5 units BID, solu-medrol, zinc sulfate, remdesivir  Labs reviewed. CBG's: 86-178 x 24 hours  NUTRITION - FOCUSED PHYSICAL EXAM:  Unable to complete at this time. RD working remotely.  Diet Order:   Diet Order            Diet regular Room service appropriate? Yes; Fluid consistency: Thin  Diet effective now              EDUCATION NEEDS:   No  education needs have been identified at this time  Skin:  Skin Assessment: Reviewed RN Assessment  Last BM:  no documented BM  Height:   Ht Readings from Last 1 Encounters:  11/13/19 5\' 2"  (1.575 m)    Weight:   Wt Readings from Last 1 Encounters:  11/14/19 66.7 kg    Ideal Body Weight:  50 kg  BMI:  Body mass index is 26.9 kg/m.  Estimated Nutritional Needs:   Kcal:  1600-1800  Protein:  75-90 grams  Fluid:  >/= 1.5 L    Gaynell Face, MS, RD, LDN Inpatient Clinical Dietitian Pager: [phone] Weekend/After Hours: [phone]

## 2019-11-14 NOTE — Evaluation (Signed)
Physical Therapy Evaluation Patient Details Name: Sylvia Turner MRN: [id number] DOB: [birth year] Today's Date: 11/14/2019   History of Present Illness  84 year old female admitted from [facility] with COVID and hypoxia; PMH includes Polio (childhood), post herpetic neualgia, COPD, tobacco use (quit over 30 years ago), anxiety, RLS, hypothyroidism.  Clinical Impression  Very pleasant female patient presents with generalized weakness and easily desats with min exertion. In PLOF she was independent in all ADLs, IADLs including driving. She lives in a family home. She relates that several family members are also COVID positive. She is very motivated and receptive to PT. She did not use any AD for ambulation . She should benefit from PT while here to address goals as outlined to promote optimal functional outcomes.     Follow Up Recommendations Home health PT    Equipment Recommendations       Recommendations for Other Services       Precautions / Restrictions Precautions Precautions: Fall Precaution Comments: Monitor O2 sats very closely- she desats very easily- min exertion. Was not using oxygen at home. Restrictions Weight Bearing Restrictions: No      Mobility  Bed Mobility                  Transfers   Equipment used: Rolling walker (2 wheeled)             General transfer comment: Did not test on bed mobility and was already seated in BS chair when PT arrived.  Ambulation/Gait Ambulation/Gait assistance: Min assist Gait Distance (Feet): 5 Feet(3 forward, 2 backward, and desatted to 74) Assistive device: Rolling walker (2 wheeled) Gait Pattern/deviations: Shuffle;Narrow base of support        Stairs            Wheelchair Mobility    Modified Rankin (Stroke Patients Only)       Balance Overall balance assessment: Mild deficits observed, not formally tested                                           Pertinent Vitals/Pain       Home Living Family/patient expects to be discharged to:: Private residence   Available Help at Discharge: Family;Available 24 hours/day Type of Home: House       Home Layout: One level   Additional Comments: Has a cane and a RW at home, but they belong to other family members    Prior Function Level of Independence: Independent               Hand Dominance   Dominant Hand: Right    Extremity/Trunk Assessment        Lower Extremity Assessment Lower Extremity Assessment: Defer to PT evaluation;Generalized weakness    Cervical / Trunk Assessment Cervical / Trunk Assessment: Normal  Communication   Communication: No difficulties  Cognition Arousal/Alertness: Awake/alert   Overall Cognitive Status: Within Functional Limits for tasks assessed                                        General Comments General comments (skin integrity, edema, etc.): Frail skin, prone to bruising and skin tears- edema noted in both LEs , L>R (she said the left leg was the most involved when she had polio as a  child- but that she normally did not have swelling    Exercises General Exercises - Lower Extremity Ankle Circles/Pumps: AROM;Seated Quad Sets: AROM;Seated Short Arc Quad: AROM;Seated Heel Slides: AROM;Seated Hip ABduction/ADduction: AROM;Seated Other Exercises Other Exercises: Practiced with IS and able to achieve 500 for 8 out of 10 reps. Good return demo with FLUTTER unit. Reminded to use very hour for 8-10 breaths   Assessment/Plan    PT Assessment Patient needs continued PT services  PT Problem List Decreased strength;Decreased activity tolerance;Decreased mobility;Cardiopulmonary status limiting activity       PT Treatment Interventions Gait training;Functional mobility training;Therapeutic activities;Therapeutic exercise;Patient/family education    PT Goals (Current goals can be found in the Care Plan section)  Acute Rehab PT Goals Patient  Stated Goal: Just want to go home as soon as possible. PT Goal Formulation: With patient Time For Goal Achievement: 11/28/19 Potential to Achieve Goals: Good    Frequency Min 3X/week   Barriers to discharge        Co-evaluation               AM-PAC PT "6 Clicks" Mobility  Outcome Measure Help needed turning from your back to your side while in a flat bed without using bedrails?: A Little Help needed moving from lying on your back to sitting on the side of a flat bed without using bedrails?: A Little Help needed moving to and from a bed to a chair (including a wheelchair)?: A Little Help needed standing up from a chair using your arms (e.g., wheelchair or bedside chair)?: A Little Help needed to walk in hospital room?: A Little Help needed climbing 3-5 steps with a railing? : A Lot 6 Click Score: 17    End of Session   Activity Tolerance: Patient limited by fatigue Patient left: in chair;with call bell/phone within reach   PT Visit Diagnosis: Muscle weakness (generalized) (M62.81)    Time: 0115-0215 PT Time Calculation (min) (ACUTE ONLY): 60 min   Charges:   PT Evaluation $PT Eval Moderate Complexity: 1 Mod PT Treatments $Gait Training: 8-22 mins $Therapeutic Exercise: 23-37 mins $Therapeutic Activity: 8-22 mins        Teyona Nichelson P, PT # [phone] CGV cell   Casandra Doffing 11/14/2019, 3:03 PM

## 2019-11-14 NOTE — Progress Notes (Signed)
PROGRESS NOTE                                                                                                                                                                                                             Patient Demographics:    Sylvia Turner, is a 84 y.o. female, DOB - 03/15/1936, LID:[id number]  Outpatient Primary MD for the patient is Asencion Noble, MD    LOS - 1  Admit date - 11/13/2019    Chief Complaint  Patient presents with  . Shortness of Breath       Brief Narrative - Sylvia Turner is a 84 y.o. female with past medical history significant for postherpetic neuralgia, COPD (no requiring oxygen and only using as needed albuterol), prior history of tobacco use, hypothyroidism, restless leg syndrome and insomnia; who was diagnosed with coronavirus infection on 11/02/19 she was diagnosed with acute hypoxic respiratory failure due to COVID-19 infection she was admitted to [facility] and then transferred to [facility].   Subjective:    Theodoro Grist today has, No headache, No chest pain, No abdominal pain - No Nausea, No new weakness tingling or numbness, improved SOB.   Assessment  & Plan :     1. Acute Hypoxic Resp. Failure due to Acute Covid 19 Viral Pneumonitis during the ongoing 2020 Covid 19 Pandemic - she has severe disease she was treated appropriately with IV steroids, remdesivir and Actemra, continue IV steroids and remdesivir and monitor closely.  Clinically she has improved from 15 L nasal cannula oxygen to 5 L.  Encouraged the patient to sit up in chair in the daytime use I-S and flutter valve for pulmonary toiletry and then prone in bed when at night.   SpO2: 93 % O2 Flow Rate (L/min): 5 L/min  Recent Labs  Lab 11/13/19 1236 11/14/19 0350  CRP 28.0*  --   DDIMER 15.26* 7.22*  FERRITIN 413*  --   BNP 121.0*  --   PROCALCITON 0.27  --     Hepatic Function Latest Ref Rng & Units  11/14/2019 11/13/2019  Total Protein 6.5 - 8.1 g/dL 5.9(L) 6.3(L)  Albumin 3.5 - 5.0 g/dL 2.5(L) 2.6(L)  AST 15 - 41 U/L 49(H) 64(H)  ALT 0 - 44 U/L 30 36  Alk Phosphatase 38 - 126 U/L 92 88  Total Bilirubin  0.3 - 1.2 mg/dL 0.4 1.8(H)     2.  Crackles on exam.  Will check echocardiogram which was ordered earlier and pending, Lasix for now and monitor.  Could have underlying CHF.  3.  Non-ACS pattern elevated troponin.  Likely due to hypoxia demand mismatch from #1 above.  No chest pain, EKG nonacute, echo pending.  Symptom-free will monitor.  Will place on baby aspirin.   4.  Hypothyroidism.  Continue home dose Synthroid.  5.  Anxiety.  As needed Xanax.  6.  History of postherpetic neuralgia and restless leg syndrome.  On combination of Neurontin and Mirapex.    7.  Insomnia.  Trazodone nightly.    8. Hyperglycemia steroid-induced.  Check A1c, sliding scale for now.  No results found for: HGBA1C CBG (last 3)  Recent Labs    11/14/19 0502 11/14/19 0740 11/14/19 1138  GLUCAP 91 86 178*    Condition -   Guarded  Family Communication  : Daughter on 11/14/2019  Code Status :  Full  Diet :   Diet Order            Diet 2 gram sodium Room service appropriate? Yes; Fluid consistency: Thin  Diet effective now               Disposition Plan  :  Home  Consults  :  None  Procedures  :    TTE   PUD Prophylaxis : None  DVT Prophylaxis  :  Lovenox added  Lab Results  Component Value Date   PLT 279 11/14/2019    Inpatient Medications  Scheduled Meds: . amLODipine  10 mg Oral Daily  . vitamin C  500 mg Oral Daily  . furosemide  40 mg Intravenous Once  . gabapentin  300 mg Oral QHS  . insulin aspart  0-9 Units Subcutaneous Q4H  . insulin detemir  0.075 Units/kg Subcutaneous BID  . Ipratropium-Albuterol  1 puff Inhalation Q6H  . levothyroxine  100 mcg Oral QAC breakfast  . methylPREDNISolone (SOLU-MEDROL) injection  60 mg Intravenous Daily  . pramipexole  0.5  mg Oral QPM  . zinc sulfate  220 mg Oral Daily   Continuous Infusions: . remdesivir 100 mg in NS 100 mL 100 mg (11/14/19 1151)   PRN Meds:.acetaminophen, ALPRAZolam, chlorpheniramine-HYDROcodone, guaiFENesin-dextromethorphan, ondansetron **OR** ondansetron (ZOFRAN) IV, traZODone  Antibiotics  :    Anti-infectives (From admission, onward)   Start     Dose/Rate Route Frequency Ordered Stop   11/14/19 1000  remdesivir 100 mg in sodium chloride 0.9 % 100 mL IVPB     100 mg 200 mL/hr over 30 Minutes Intravenous Daily 11/13/19 1509 11/18/19 0959   11/13/19 1515  remdesivir 200 mg in sodium chloride 0.9% 250 mL IVPB     200 mg 580 mL/hr over 30 Minutes Intravenous Once 11/13/19 1509 11/13/19 1655       Time Spent in minutes  30   Lala Lund M.D on 11/14/2019 at 1:04 PM  To page go to [email or web address] - password [facility]  [facility] -  Office  [phone]   See all Orders from today for further details    Objective:   Vitals:   11/14/19 0400 11/14/19 0500 11/14/19 0741 11/14/19 1136  BP: 137/73  (!) 149/77 (!) 172/91  Pulse: 78  80 100  Resp: (!) '24  20 20  ' Temp: 98.1 F (36.7 C)  (!) 97.5 F (36.4 C) 97.8 F (36.6 C)  TempSrc: Oral  Oral Oral  SpO2:  95%  95% 93%  Weight:  66.7 kg    Height:        Wt Readings from Last 3 Encounters:  11/14/19 66.7 kg  10/17/14 63.4 kg     Intake/Output Summary (Last 24 hours) at 11/14/2019 1304 Last data filed at 11/14/2019 1100 Gross per 24 hour  Intake 120 ml  Output --  Net 120 ml     Physical Exam  Awake Alert,  No new F.N deficits, anxious affect [place].AT,PERRAL Supple Neck,No JVD, No cervical lymphadenopathy appriciated.  Symmetrical Chest wall movement, Good air movement bilaterally, +ve rales RRR,No Gallops,Rubs or new Murmurs, No Parasternal Heave +ve B.Sounds, Abd Soft, No tenderness, No organomegaly appriciated, No rebound - guarding or rigidity. No Cyanosis, Clubbing or edema, No new Rash or bruise      Data Review:    CBC Recent Labs  Lab 11/13/19 1236 11/14/19 0350  WBC 19.1* 17.0*  HGB 13.8 13.6  HCT 42.0 42.5  PLT 249 279  MCV 88.8 89.9  MCH 29.2 28.8  MCHC 32.9 32.0  RDW 14.3 14.3  LYMPHSABS 2.1 1.5  MONOABS 3.4* 2.1*  EOSABS 0.2 0.0  BASOSABS 0.0 0.2*    Chemistries  Recent Labs  Lab 11/13/19 1236 11/14/19 0350  NA 135 139  K 5.4* 4.1  CL 101 102  CO2 25 22  GLUCOSE 104* 87  BUN 11 18  CREATININE 0.60 0.58  CALCIUM 7.8* 7.9*  MG  --  1.9  AST 64* 49*  ALT 36 30  ALKPHOS 88 92  BILITOT 1.8* 0.4   ------------------------------------------------------------------------------------------------------------------ Recent Labs    11/13/19 1236  TRIG 109    No results found for: HGBA1C ------------------------------------------------------------------------------------------------------------------ Recent Labs    11/14/19 0350  TSH 7.446*    Cardiac Enzymes No results for input(s): CKMB, TROPONINI, MYOGLOBIN in the last 168 hours.  Invalid input(s): CK ------------------------------------------------------------------------------------------------------------------    Component Value Date/Time   BNP 121.0 (H) 11/13/2019 1236    Micro Results Recent Results (from the past 240 hour(s))  Blood Culture (routine x 2)     Status: None (Preliminary result)   Collection Time: 11/13/19 12:36 PM   Specimen: BLOOD RIGHT FOREARM  Result Value Ref Range Status   Specimen Description BLOOD RIGHT FOREARM  Final   Special Requests   Final    BOTTLES DRAWN AEROBIC AND ANAEROBIC Blood Culture results may not be optimal due to an inadequate volume of blood received in culture bottles Performed at [facility], [address], [place], [place] [address]    Culture PENDING  Incomplete   Report Status PENDING  Incomplete  Blood Culture (routine x 2)     Status: None (Preliminary result)   Collection Time: 11/13/19 12:41 PM   Specimen: Right Antecubital; Blood    Result Value Ref Range Status   Specimen Description RIGHT ANTECUBITAL  Final   Special Requests   Final    BOTTLES DRAWN AEROBIC AND ANAEROBIC Blood Culture adequate volume Performed at [facility], [address], [place], [place] [address]    Culture PENDING  Incomplete   Report Status PENDING  Incomplete    Radiology Reports DG Chest Port 1 View  Result Date: 11/13/2019 CLINICAL DATA:  Pt c/o worsening SOB. Pt diagnosed with COVID 12/31. Hx former smoker cough, sob, covid + EXAM: PORTABLE CHEST 1 VIEW COMPARISON:  01/17/2008 FINDINGS: Normal cardiac silhouette. There is patchy bilateral peripheral nodular airspace densities. No pleural fluid. No pneumothorax. No acute osseous abnormality. IMPRESSION: Patchy peripheral  nodular airspace disease consistent with COVID viral pneumonia. Electronically Signed   By: Suzy Bouchard M.D.   On: 11/13/2019 12:58

## 2019-11-14 NOTE — Progress Notes (Signed)
  Echocardiogram 2D Echocardiogram has been performed.  Sylvia Turner 11/14/2019, 9:09 AM

## 2019-11-14 NOTE — Progress Notes (Signed)
ANTICOAGULATION CONSULT NOTE - Initial Consult  Pharmacy Consult for Lovenox Indication: VTE prophylaxis - COVID protocol  Allergies  Allergen Reactions  . Hydrocodone Nausea And Vomiting  . Other     Ex. Perfume smells, candle shop    Patient Measurements: Height: 5\' 2"  (157.5 cm) Weight: 147 lb 0.8 oz (66.7 kg) IBW/kg (Calculated) : 50.1  Vital Signs: Temp: 97.8 F (36.6 C) (01/12 1136) Temp Source: Oral (01/12 1136) BP: 172/91 (01/12 1136) Pulse Rate: 100 (01/12 1136)  Labs: Recent Labs    11/13/19 1236 11/13/19 1325 11/13/19 1511 11/14/19 0350  HGB 13.8  --   --  13.6  HCT 42.0  --   --  42.5  PLT 249  --   --  279  CREATININE 0.60  --   --  0.58  TROPONINIHS  --  229* 386*  --     Estimated Creatinine Clearance: 49.4 mL/min (by C-G formula based on SCr of 0.58 mg/dL).   Medical History: Past Medical History:  Diagnosis Date  . Deaf L Ear  . Hip problem   . Post herpetic neuralgia     Medications:  Scheduled:  . amLODipine  10 mg Oral Daily  . vitamin C  500 mg Oral Daily  . aspirin  81 mg Oral Daily  . enoxaparin (LOVENOX) injection  40 mg Subcutaneous Q12H  . furosemide  40 mg Intravenous Once  . gabapentin  300 mg Oral QHS  . insulin aspart  0-9 Units Subcutaneous Q4H  . insulin detemir  0.075 Units/kg Subcutaneous BID  . Ipratropium-Albuterol  1 puff Inhalation Q6H  . levothyroxine  100 mcg Oral QAC breakfast  . methylPREDNISolone (SOLU-MEDROL) injection  60 mg Intravenous Daily  . pramipexole  0.5 mg Oral QPM  . zinc sulfate  220 mg Oral Daily   Infusions:  . remdesivir 100 mg in NS 100 mL 100 mg (11/14/19 1151)   PRN: acetaminophen, ALPRAZolam, chlorpheniramine-HYDROcodone, guaiFENesin-dextromethorphan, ondansetron **OR** ondansetron (ZOFRAN) IV, traZODone  Assessment: 84 yo female with COVID pneumonia.  Pharmacy consulted to dose Lovenox for VTE prophylaxis.  Weight 66.7kg SCr 0.58, CrCl ~50 ml/min D-dimer 15.26 > 7.22  Goal of  Therapy:  VTE prophylaxis Monitor platelets by anticoagulation protocol: Yes   Plan:  Lovenox 40mg  SQ q12h  Monitor CBC, ability to change to daily based on q24h based on d-dimer, clinical progress  Peggyann Juba, PharmD, BCPS Pharmacy: [phone] 11/14/2019,1:24 PM

## 2019-11-14 NOTE — Plan of Care (Signed)
  Problem: Education: Goal: Knowledge of risk factors and measures for prevention of condition will improve Outcome: Progressing   Problem: Coping: Goal: Psychosocial and spiritual needs will be supported Outcome: Progressing   Problem: Respiratory: Goal: Will maintain a patent airway Outcome: Progressing Goal: Complications related to the disease process, condition or treatment will be avoided or minimized Outcome: Progressing   

## 2019-11-15 DIAGNOSIS — J069 Acute upper respiratory infection, unspecified: Secondary | ICD-10-CM

## 2019-11-15 DIAGNOSIS — I5031 Acute diastolic (congestive) heart failure: Secondary | ICD-10-CM

## 2019-11-15 LAB — CBC WITH DIFFERENTIAL/PLATELET
Abs Immature Granulocytes: 0.64 10*3/uL — ABNORMAL HIGH (ref 0.00–0.07)
Basophils Absolute: 0.1 10*3/uL (ref 0.0–0.1)
Basophils Relative: 1 %
Eosinophils Absolute: 0 10*3/uL (ref 0.0–0.5)
Eosinophils Relative: 0 %
HCT: 37.9 % (ref 36.0–46.0)
Hemoglobin: 12.3 g/dL (ref 12.0–15.0)
Immature Granulocytes: 4 %
Lymphocytes Relative: 10 %
Lymphs Abs: 1.6 10*3/uL (ref 0.7–4.0)
MCH: 28.6 pg (ref 26.0–34.0)
MCHC: 32.5 g/dL (ref 30.0–36.0)
MCV: 88.1 fL (ref 80.0–100.0)
Monocytes Absolute: 0.8 10*3/uL (ref 0.1–1.0)
Monocytes Relative: 5 %
Neutro Abs: 13 10*3/uL — ABNORMAL HIGH (ref 1.7–7.7)
Neutrophils Relative %: 80 %
Platelets: 372 10*3/uL (ref 150–400)
RBC: 4.3 MIL/uL (ref 3.87–5.11)
RDW: 14.4 % (ref 11.5–15.5)
WBC: 16 10*3/uL — ABNORMAL HIGH (ref 4.0–10.5)
nRBC: 0 % (ref 0.0–0.2)

## 2019-11-15 LAB — COMPREHENSIVE METABOLIC PANEL
ALT: 26 U/L (ref 0–44)
AST: 30 U/L (ref 15–41)
Albumin: 2.4 g/dL — ABNORMAL LOW (ref 3.5–5.0)
Alkaline Phosphatase: 81 U/L (ref 38–126)
Anion gap: 9 (ref 5–15)
BUN: 20 mg/dL (ref 8–23)
CO2: 25 mmol/L (ref 22–32)
Calcium: 7.9 mg/dL — ABNORMAL LOW (ref 8.9–10.3)
Chloride: 102 mmol/L (ref 98–111)
Creatinine, Ser: 0.52 mg/dL (ref 0.44–1.00)
GFR calc Af Amer: >60 mL/min (ref >60)
GFR calc non Af Amer: >60 mL/min (ref >60)
Glucose, Bld: 145 mg/dL — ABNORMAL HIGH (ref 70–99)
Potassium: 4 mmol/L (ref 3.5–5.1)
Sodium: 136 mmol/L (ref 135–145)
Total Bilirubin: 0.3 mg/dL (ref 0.3–1.2)
Total Protein: 5.4 g/dL — ABNORMAL LOW (ref 6.5–8.1)

## 2019-11-15 LAB — GLUCOSE, CAPILLARY
Glucose-Capillary: 114 mg/dL — ABNORMAL HIGH (ref 70–99)
Glucose-Capillary: 141 mg/dL — ABNORMAL HIGH (ref 70–99)
Glucose-Capillary: 145 mg/dL — ABNORMAL HIGH (ref 70–99)
Glucose-Capillary: 154 mg/dL — ABNORMAL HIGH (ref 70–99)
Glucose-Capillary: 233 mg/dL — ABNORMAL HIGH (ref 70–99)

## 2019-11-15 LAB — BRAIN NATRIURETIC PEPTIDE: B Natriuretic Peptide: 71.7 pg/mL (ref 0.0–100.0)

## 2019-11-15 LAB — MAGNESIUM: Magnesium: 2.2 mg/dL (ref 1.7–2.4)

## 2019-11-15 LAB — PROCALCITONIN: Procalcitonin: 0.11 ng/mL

## 2019-11-15 LAB — D-DIMER, QUANTITATIVE: D-Dimer, Quant: 5.08 ug/mL-FEU — ABNORMAL HIGH (ref 0.00–0.50)

## 2019-11-15 LAB — C-REACTIVE PROTEIN: CRP: 8.2 mg/dL — ABNORMAL HIGH (ref <1.0)

## 2019-11-15 MED ORDER — FUROSEMIDE 10 MG/ML IJ SOLN
40.0000 mg | Freq: Every day | INTRAMUSCULAR | Status: DC
  Administered 2019-11-15 – 2019-11-18 (×4): 40 mg via INTRAVENOUS
  Filled 2019-11-15 (×4): qty 4

## 2019-11-15 MED ORDER — LEVOTHYROXINE SODIUM 75 MCG PO TABS
125.0000 ug | ORAL_TABLET | Freq: Every day | ORAL | Status: DC
  Administered 2019-11-16 – 2019-11-18 (×3): 125 ug via ORAL
  Filled 2019-11-15 (×3): qty 1

## 2019-11-15 NOTE — Progress Notes (Signed)
Occupational Therapy Evaluation Patient Details Name: Sylvia Turner MRN: [id number] DOB: 14-Sep-1936 Today's Date: 11/15/2019    History of Present Illness 84 year old female admitted from [facility] with COVID and hypoxia; PMH includes Polio (childhood), post herpetic neualgia, COPD, tobacco use (quit over 30 years ago), anxiety, RLS, hypothyroidism.   Clinical Impression   PTA pt lived with her daughter, independent in ADL, IADL, and mobility tasks. Pt does not ambulate with an assistive device and reports 0 falls in the last 6 months. Pt does not use oxygen at home and is currently on 7L HFNC at the hospital. Pt currently independent to min guard for self-care and mobility tasks. Pt tolerated standing 1 x 4 min with SpO2 decreasing to 83% on 7L HFNC. Pt required 1 min seated recovery to return back to 90%. Pt able to ambulate to/from bathroom with RW and min guard to ensure balance and safety. Pt completed toileting task, able to wash hands at the sink in standing. SpO2 dropped to 75% on 8L HFNC during activity. Pt required 2-3 min seated rest break to recover to 94%. Pt reported mod shortness of breath throughout. Educated and provided pt with handout regarding energy conservation techniques. Pt demonstrates decreased strength, endurance, balance, standing tolerance, and activity tolerance impacting ability to complete self-care and functional transfer tasks. Recommend skilled OT services to address above deficits in order to promote function and prevent further decline. Recommend [facility] for additional rehab following hospital discharge.     Follow Up Recommendations  Home health OT;Supervision/Assistance - 24 hour    Equipment Recommendations  3 in 1 bedside commode(for use in shower)    Recommendations for Other Services       Precautions / Restrictions Precautions Precautions: Fall;Other (comment) Precaution Comments: Monitor O2, desats quickly with activity Restrictions Weight  Bearing Restrictions: No      Mobility Bed Mobility               General bed mobility comments: Pt seated in bedside chair upon OT arrival.  Transfers Overall transfer level: Needs assistance Equipment used: Rolling walker (2 wheeled) Transfers: Sit to/from [facility] Sit to Stand: Min guard Stand pivot transfers: Min guard       General transfer comment: To ensure balance and safety    Balance Overall balance assessment: Mild deficits observed, not formally tested                                         ADL either performed or assessed with clinical judgement   ADL Overall ADL's : Needs assistance/impaired Eating/Feeding: Independent;Sitting   Grooming: Supervision/safety;Set up;Standing   Upper Body Bathing: Set up;Supervision/ safety;Sitting   Lower Body Bathing: Supervison/ safety;Sit to/from stand;Sitting/lateral leans;Minimal assistance   Upper Body Dressing : Supervision/safety;Set up;Sitting   Lower Body Dressing: Supervision/safety;Min guard;Sit to/from stand;Sitting/lateral leans   Toilet Transfer: Min guard;Regular Toilet;Grab bars;Ambulation   Toileting- Clothing Manipulation and Hygiene: Min guard;Sit to/from stand       Functional mobility during ADLs: Min guard;Rolling walker General ADL Comments: Pt able to ambulate to/from bathroom with RW and min guard for balance and safety. Noted 0 instances of LOB, however pt unsteady on feet.      Vision Baseline Vision/History: No visual deficits       Perception     Praxis      Pertinent Vitals/Pain Pain Assessment: No/denies pain  Hand Dominance Right   Extremity/Trunk Assessment Upper Extremity Assessment Upper Extremity Assessment: Overall WFL for tasks assessed   Lower Extremity Assessment Lower Extremity Assessment: Defer to PT evaluation       Communication Communication Communication: No difficulties   Cognition Arousal/Alertness:  Awake/alert Behavior During Therapy: WFL for tasks assessed/performed Overall Cognitive Status: Within Functional Limits for tasks assessed                                     General Comments  Pt on 7L HFNC with SpO2 96% at rest. Pt able to ambulate to/from bathroom and complete toileting task on 8L with SpO2 decreasing to 75%. Pt required 2-3 min seated rest break to recover to 94% on 7L HFNC. Pt reported mod SOB throughout.    Exercises     Shoulder Instructions      Home Living Family/patient expects to be discharged to:: Private residence Living Arrangements: Children;Other (Comment)(Daughter, son in law, and 1 grandchild) Available Help at Discharge: Family;Available 24 hours/day Type of Home: House       Home Layout: One level     Bathroom Shower/Tub: Occupational psychologist: Standard Bathroom Accessibility: Yes   Home Equipment: Cane - single point          Prior Functioning/Environment Level of Independence: Independent        Comments: Pt independent in ADLs, IADLs, and mobility. Pt does not ambulate with an assistive device, but reports using cane when she started feeling ill. Pt reports 0 falls in the last 6 months. Pt still drives. Pt does not wear oxygen at home.        OT Problem List: Decreased strength;Decreased activity tolerance;Impaired balance (sitting and/or standing);Cardiopulmonary status limiting activity      OT Treatment/Interventions: Self-care/ADL training;Therapeutic exercise;Neuromuscular education;Energy conservation;DME and/or AE instruction;Therapeutic activities;Patient/family education;Balance training    OT Goals(Current goals can be found in the care plan section) Acute Rehab OT Goals Patient Stated Goal: to go home Time For Goal Achievement: 11/29/19 Potential to Achieve Goals: Good ADL Goals Pt Will Perform Grooming: with modified independence;standing Pt Will Perform Lower Body Bathing: with  modified independence;sit to/from stand Pt Will Perform Lower Body Dressing: with modified independence;sit to/from stand Pt Will Transfer to Toilet: with modified independence;ambulating;regular height toilet Pt Will Perform Toileting - Clothing Manipulation and hygiene: with modified independence;sit to/from stand Additional ADL Goal #1: Pt to recall and verbalize 3 energy conservation techniques with 0 verbal cues. Additional ADL Goal #2: Pt to recall and verbalize 3 fall prevention strategies with 0 verbal cues. Additional ADL Goal #3: Pt to tolerate standing up to 10 min with modified independence and SpO2 maintaining above 90%, in preparation for ADLs.  OT Frequency: Min 3X/week   Barriers to D/C:            Co-evaluation              AM-PAC OT "6 Clicks" Daily Activity     Outcome Measure Help from another person eating meals?: None Help from another person taking care of personal grooming?: A Little Help from another person toileting, which includes using toliet, bedpan, or urinal?: A Little Help from another person bathing (including washing, rinsing, drying)?: A Little Help from another person to put on and taking off regular upper body clothing?: A Little Help from another person to put on and taking off regular lower body clothing?:  A Little 6 Click Score: 19   End of Session Equipment Utilized During Treatment: Rolling walker;Oxygen Nurse Communication: Mobility status  Activity Tolerance: Patient limited by fatigue(Limited by SOB) Patient left: in chair;with call bell/phone within reach  OT Visit Diagnosis: Unsteadiness on feet (R26.81);Muscle weakness (generalized) (M62.81)                Time: [id number] OT Time Calculation (min): 49 min Charges:  OT General Charges $OT Visit: 1 Visit OT Evaluation $OT Eval Moderate Complexity: 1 Mod OT Treatments $Self Care/Home Management : 8-22 mins $Therapeutic Activity: 8-22 mins  Mauri Brooklyn  OTR/L [phone]   Mauri Brooklyn 11/15/2019, 1:25 PM

## 2019-11-15 NOTE — Progress Notes (Signed)
Family Update   Primary nurse called patient daughter to review status and POC along with discharge planning. Daughter verbalized comprehension of teaching. No further issues at time of entry.

## 2019-11-15 NOTE — Progress Notes (Signed)
PROGRESS NOTE                                                                                                                                                                                                             Patient Demographics:    Sylvia Turner, is a 84 y.o. female, DOB - 1935-11-25, HVF:[id number]  Outpatient Primary MD for the patient is Asencion Noble, MD    LOS - 2  Admit date - 11/13/2019    Chief Complaint  Patient presents with  . Shortness of Breath       Brief Narrative - Sylvia Turner is a 84 y.o. female with past medical history significant for postherpetic neuralgia, COPD (no requiring oxygen and only using as needed albuterol), prior history of tobacco use, hypothyroidism, restless leg syndrome and insomnia; who was diagnosed with coronavirus infection on 11/02/19 she was diagnosed with acute hypoxic respiratory failure due to COVID-19 infection she was admitted to [facility] and then [facility] to [facility].   Subjective:    Sylvia Turner today has, No headache, No chest pain, No abdominal pain - No Nausea, No new weakness tingling or numbness, improved SOB.   Assessment  & Plan :     Acute Hypoxic Resp. Failure due to Acute Covid 19 Viral Pneumonitis during the ongoing 2020 Covid 19 Pandemic  - she has severe disease she was treated appropriately with IV steroids, remdesivir and Actemra, continue IV steroids and remdesivir and monitor closely.  She was encouraged to use incentive spirometry, flutter valve, to get out of bed to chair, and to prone once able, on 15 L nasal cannula initially, this morning he is on 6 L oxygen.   SpO2: 90 % O2 Flow Rate (L/min): 7 L/min FiO2 (%): 100 %  Recent Labs  Lab 11/13/19 1236 11/14/19 0350 11/15/19 0404  CRP 28.0* 24.4* 8.2*  DDIMER 15.26* 7.22* 5.08*  FERRITIN 413* 375*  --   BNP 121.0* 114.6* 71.7  PROCALCITON 0.27 0.10 0.11    Hepatic Function  Latest Ref Rng & Units 11/15/2019 11/14/2019 11/13/2019  Total Protein 6.5 - 8.1 g/dL 5.4(L) 5.9(L) 6.3(L)  Albumin 3.5 - 5.0 g/dL 2.4(L) 2.5(L) 2.6(L)  AST 15 - 41 U/L 30 49(H) 64(H)  ALT 0 - 44 U/L 26 30 36  Alk Phosphatase 38 - 126 U/L 81  92 88  Total Bilirubin 0.3 - 1.2 mg/dL 0.3 0.4 1.8(H)     Acute diastolic CHF -2D echo with grade 1 diastolic dysfunction, volume overload on physical exam, continue with IV diuresis.  elevated troponin -Non-ACS pattern, in the setting of demand ischemia, 2D echo with no regional wall motion abnormality. - No chest pain, EKG nonacute, echo pending.  Symptom-free will monitor.  Will place on baby aspirin.  Hypothyroidism.   - Continue home dose Synthroid, TSH elevated at 7, increase Synthroid to 125 mcg daily  Anxiety.  - As needed Xanax.  History of postherpetic neuralgia and restless leg syndrome.  On combination of Neurontin and Mirapex.    Insomnia.  Trazodone nightly.    Hyperglycemia steroid-induced.  Check A1c, sliding scale for now.  Lab Results  Component Value Date   HGBA1C 6.1 (H) 11/14/2019   CBG (last 3)  Recent Labs    11/15/19 0435 11/15/19 0742 11/15/19 1150  GLUCAP 154* 114* 233*    Condition -   Guarded  Family Communication  : Daughter on 11/14/2019  Code Status :  Full  Diet :   Diet Order            Diet regular Room service appropriate? Yes; Fluid consistency: Thin  Diet effective now               Disposition Plan  :  Home  Consults  :  None  Procedures  :    TTE   PUD Prophylaxis : None  DVT Prophylaxis  :  Lovenox added  Lab Results  Component Value Date   PLT 372 11/15/2019    Inpatient Medications  Scheduled Meds: . amLODipine  10 mg Oral Daily  . vitamin C  500 mg Oral Daily  . aspirin  81 mg Oral Daily  . enoxaparin (LOVENOX) injection  40 mg Subcutaneous Q12H  . gabapentin  300 mg Oral QHS  . insulin aspart  0-9 Units Subcutaneous Q4H  . insulin detemir  0.075 Units/kg  Subcutaneous BID  . Ipratropium-Albuterol  1 puff Inhalation Q6H  . levothyroxine  100 mcg Oral QAC breakfast  . methylPREDNISolone (SOLU-MEDROL) injection  60 mg Intravenous Daily  . pramipexole  0.5 mg Oral QPM  . zinc sulfate  220 mg Oral Daily   Continuous Infusions: . remdesivir 100 mg in NS 100 mL 100 mg (11/15/19 0915)   PRN Meds:.acetaminophen, ALPRAZolam, chlorpheniramine-HYDROcodone, guaiFENesin-dextromethorphan, ondansetron **OR** ondansetron (ZOFRAN) IV, traZODone  Antibiotics  :    Anti-infectives (From admission, onward)   Start     Dose/Rate Route Frequency Ordered Stop   11/14/19 1000  remdesivir 100 mg in sodium chloride 0.9 % 100 mL IVPB     100 mg 200 mL/hr over 30 Minutes Intravenous Daily 11/13/19 1509 11/18/19 0959   11/13/19 1515  remdesivir 200 mg in sodium chloride 0.9% 250 mL IVPB     200 mg 580 mL/hr over 30 Minutes Intravenous Once 11/13/19 1509 11/13/19 1655       Time Spent in minutes  6   Phillips Climes M.D on 11/15/2019 at 1:48 PM  To page go to [email or web address] - password [facility]  [facility] -  Office  [phone]   See all Orders from today for further details    Objective:   Vitals:   11/15/19 0500 11/15/19 0749 11/15/19 1055 11/15/19 1131  BP:  137/63  (!) 133/55  Pulse: 64 84 83 91  Resp: (!) 21 20 (!) 27 (!) 22  Temp:  97.8 F (36.6 C)  (!) 97.4 F (36.3 C)  TempSrc:  Oral  Axillary  SpO2: 99% 90% 96% 90%  Weight: 66.3 kg     Height:        Wt Readings from Last 3 Encounters:  11/15/19 66.3 kg  10/17/14 63.4 kg     Intake/Output Summary (Last 24 hours) at 11/15/2019 1348 Last data filed at 11/15/2019 0915 Gross per 24 hour  Intake 1240 ml  Output 1400 ml  Net -160 ml     Physical Exam  Awake Alert, Oriented X 3, No new F.N deficits, Normal affect Symmetrical Chest wall movement, Good air movement bilaterally, bibasilar crackles RRR,No Gallops,Rubs or new Murmurs, No Parasternal Heave +ve B.Sounds, Abd  Soft, No tenderness, No rebound - guarding or rigidity. No Cyanosis, Clubbing , +2 edema, No new Rash or bruise       Data Review:    CBC Recent Labs  Lab 11/13/19 1236 11/14/19 0350 11/15/19 0404  WBC 19.1* 17.0* 16.0*  HGB 13.8 13.6 12.3  HCT 42.0 42.5 37.9  PLT 249 279 372  MCV 88.8 89.9 88.1  MCH 29.2 28.8 28.6  MCHC 32.9 32.0 32.5  RDW 14.3 14.3 14.4  LYMPHSABS 2.1 1.5 1.6  MONOABS 3.4* 2.1* 0.8  EOSABS 0.2 0.0 0.0  BASOSABS 0.0 0.2* 0.1    Chemistries  Recent Labs  Lab 11/13/19 1236 11/14/19 0350 11/15/19 0404  NA 135 139 136  K 5.4* 4.1 4.0  CL 101 102 102  CO2 '25 22 25  ' GLUCOSE 104* 87 145*  BUN '11 18 20  ' CREATININE 0.60 0.58 0.52  CALCIUM 7.8* 7.9* 7.9*  MG  --  1.9 2.2  AST 64* 49* 30  ALT 36 30 26  ALKPHOS 88 92 81  BILITOT 1.8* 0.4 0.3   ------------------------------------------------------------------------------------------------------------------ Recent Labs    11/13/19 1236  TRIG 109    Lab Results  Component Value Date   HGBA1C 6.1 (H) 11/14/2019   ------------------------------------------------------------------------------------------------------------------ Recent Labs    11/14/19 0350  TSH 7.446*    Cardiac Enzymes No results for input(s): CKMB, TROPONINI, MYOGLOBIN in the last 168 hours.  Invalid input(s): CK ------------------------------------------------------------------------------------------------------------------    Component Value Date/Time   BNP 71.7 11/15/2019 0404    Micro Results Recent Results (from the past 240 hour(s))  Blood Culture (routine x 2)     Status: None (Preliminary result)   Collection Time: 11/13/19 12:36 PM   Specimen: BLOOD RIGHT FOREARM  Result Value Ref Range Status   Specimen Description BLOOD RIGHT FOREARM  Final   Special Requests   Final    BOTTLES DRAWN AEROBIC AND ANAEROBIC Blood Culture results may not be optimal due to an inadequate volume of blood received in culture  bottles   Culture   Final    NO GROWTH 2 DAYS Performed at [facility], [address], [place], [place] [address]    Report Status PENDING  Incomplete  Blood Culture (routine x 2)     Status: None (Preliminary result)   Collection Time: 11/13/19 12:41 PM   Specimen: Right Antecubital; Blood  Result Value Ref Range Status   Specimen Description RIGHT ANTECUBITAL  Final   Special Requests   Final    BOTTLES DRAWN AEROBIC AND ANAEROBIC Blood Culture adequate volume   Culture   Final    NO GROWTH 2 DAYS Performed at [facility], [address] [place], [place] [address]    Report Status PENDING  Incomplete  Radiology Reports DG Chest Port 1 View  Result Date: 11/13/2019 CLINICAL DATA:  Pt c/o worsening SOB. Pt diagnosed with COVID 12/31. Hx former smoker cough, sob, covid + EXAM: PORTABLE CHEST 1 VIEW COMPARISON:  01/17/2008 FINDINGS: Normal cardiac silhouette. There is patchy bilateral peripheral nodular airspace densities. No pleural fluid. No pneumothorax. No acute osseous abnormality. IMPRESSION: Patchy peripheral nodular airspace disease consistent with COVID viral pneumonia. Electronically Signed   By: Suzy Bouchard M.D.   On: 11/13/2019 12:58   ECHOCARDIOGRAM COMPLETE  Result Date: 11/14/2019   ECHOCARDIOGRAM REPORT   Patient Name:   Sylvia Turner Date of Exam: 11/14/2019 Medical Rec #:  [id number]      Height:       62.0 in Accession #:    [id number]     Weight:       147.0 lb Date of Birth:  [birth year]      BSA:          1.68 m Patient Age:    82 years       BP:           149/77 mmHg Patient Gender: F              HR:           80 bpm. Exam Location:  Inpatient Procedure: 2D Echo, Cardiac Doppler and Color Doppler Indications:    Dyspnea  History:        Patient has no prior history of Echocardiogram examinations.                 COPD, Signs/Symptoms:Shortness of Breath; Risk Factors:Former                 Smoker. Covid positive.  Sonographer:    Dustin Flock Referring  Phys: [place]  1. Left ventricular ejection fraction, by visual estimation, is 65 to 70%. The left ventricle has hyperdynamic function. There is no left ventricular hypertrophy.  2. Left ventricular diastolic parameters are consistent with Grade I diastolic dysfunction (impaired relaxation).  3. The left ventricle has no regional wall motion abnormalities.  4. Global right ventricle has normal systolic function.The right ventricular size is normal. No increase in right ventricular wall thickness.  5. Left atrial size was normal.  6. Right atrial size was normal.  7. The mitral valve is normal in structure. No evidence of mitral valve regurgitation.  8. The tricuspid valve is normal in structure.  9. The aortic valve is normal in structure. Aortic valve regurgitation is not visualized. 10. The pulmonic valve was not well visualized. Pulmonic valve regurgitation is not visualized. 11. Moderately elevated pulmonary artery systolic pressure. FINDINGS  Left Ventricle: Left ventricular ejection fraction, by visual estimation, is 65 to 70%. The left ventricle has hyperdynamic function. The left ventricle has no regional wall motion abnormalities. The left ventricular internal cavity size was the left ventricle is normal in size. There is no left ventricular hypertrophy. Left ventricular diastolic parameters are consistent with Grade I diastolic dysfunction (impaired relaxation). Normal left atrial pressure. Right Ventricle: The right ventricular size is normal. No increase in right ventricular wall thickness. Global RV systolic function is has normal systolic function. The tricuspid regurgitant velocity is 3.33 m/s, and with an assumed right atrial pressure  of 3 mmHg, the estimated right ventricular systolic pressure is moderately elevated at 47.4 mmHg. Left Atrium: Left atrial size was normal in size. Right Atrium: Right atrial size was normal in size Pericardium: There  is no evidence of  pericardial effusion. Mitral Valve: The mitral valve is normal in structure. No evidence of mitral valve regurgitation. Tricuspid Valve: The tricuspid valve is normal in structure. Tricuspid valve regurgitation mild-moderate. Aortic Valve: The aortic valve is normal in structure. Aortic valve regurgitation is not visualized. Pulmonic Valve: The pulmonic valve was not well visualized. Pulmonic valve regurgitation is not visualized. Pulmonic regurgitation is not visualized. Aorta: The aortic root and ascending aorta are structurally normal, with no evidence of dilitation. IAS/Shunts: No atrial level shunt detected by color flow Doppler.  LEFT VENTRICLE PLAX 2D LVIDd:         3.06 cm  Diastology LVIDs:         2.16 cm  LV e' lateral:   7.18 cm/s LV PW:         1.09 cm  LV E/e' lateral: 8.4 LV IVS:        1.12 cm  LV e' medial:    7.29 cm/s LVOT diam:     2.00 cm  LV E/e' medial:  8.2 LV SV:         21 ml LV SV Index:   12.32 LVOT Area:     3.14 cm  RIGHT VENTRICLE RV S prime:     20.64 cm/s TAPSE (M-mode): 2.6 cm LEFT ATRIUM             Index       RIGHT ATRIUM           Index LA diam:        2.70 cm 1.61 cm/m  RA Area:     19.40 cm LA Vol (A2C):   34.9 ml 20.81 ml/m RA Volume:   59.10 ml  35.23 ml/m LA Vol (A4C):   37.4 ml 22.30 ml/m LA Biplane Vol: 38.6 ml 23.01 ml/m  AORTIC VALVE LVOT Vmax:   102.00 cm/s LVOT Vmean:  75.000 cm/s LVOT VTI:    0.206 m  AORTA Ao Root diam: 2.90 cm MITRAL VALVE                        TRICUSPID VALVE MV Area (PHT): 5.38 cm             TR Peak grad:   44.4 mmHg MV PHT:        40.89 msec           TR Vmax:        333.00 cm/s MV Decel Time: 141 msec MV E velocity: 60.10 cm/s 103 cm/s  SHUNTS MV A velocity: 68.40 cm/s 70.3 cm/s Systemic VTI:  0.21 m MV E/A ratio:  0.88       1.5       Systemic Diam: 2.00 cm  Dani Gobble Croitoru MD Electronically signed by Sanda Klein MD Signature Date/Time: 11/14/2019/1:29:02 PM    Final

## 2019-11-15 NOTE — Progress Notes (Signed)
Pt OOB in chair at start of shift, tolerated transfer from [facility] then to bed, w/o concerns. Safe and cautious with transfers. A/Ox4, afebrile, lungs unremarkable, denies pain, HFNC10-15L, sats >95% when able to obtain good pleth.  Urine output WNL, lasix today w/good diuresis, BM x 1 this pm. BLE continue swollen, skin cool to touch, good pulses. Remdesivir as planned, encouragement needed with use and better volumne results with incentive spriomenter. CBG q4, coverage per MAR. Will monitor and update as needed.

## 2019-11-15 NOTE — Progress Notes (Signed)
Pt out of bed this am, without concerns, using bedside commode w/good outcome. Offers no complaints, states feels good, Remdesivir #3 today. Medications continue as ordered, as well as CBG q4. Report to next shift, at pts room/bedside window, then into evaluate pt for hand off. Pt alert/oriented and  w/o concerns presently, in chair and offers no compliants, unremarkable and stable at time of this RN's departure from PCU.

## 2019-11-16 LAB — COMPREHENSIVE METABOLIC PANEL
ALT: 27 U/L (ref 0–44)
AST: 29 U/L (ref 15–41)
Albumin: 2.3 g/dL — ABNORMAL LOW (ref 3.5–5.0)
Alkaline Phosphatase: 74 U/L (ref 38–126)
Anion gap: 9 (ref 5–15)
BUN: 26 mg/dL — ABNORMAL HIGH (ref 8–23)
CO2: 26 mmol/L (ref 22–32)
Calcium: 7.8 mg/dL — ABNORMAL LOW (ref 8.9–10.3)
Chloride: 104 mmol/L (ref 98–111)
Creatinine, Ser: 0.49 mg/dL (ref 0.44–1.00)
GFR calc Af Amer: >60 mL/min (ref >60)
GFR calc non Af Amer: >60 mL/min (ref >60)
Glucose, Bld: 99 mg/dL (ref 70–99)
Potassium: 3.7 mmol/L (ref 3.5–5.1)
Sodium: 139 mmol/L (ref 135–145)
Total Bilirubin: 0.8 mg/dL (ref 0.3–1.2)
Total Protein: 5.1 g/dL — ABNORMAL LOW (ref 6.5–8.1)

## 2019-11-16 LAB — CBC WITH DIFFERENTIAL/PLATELET
Abs Immature Granulocytes: 0.63 10*3/uL — ABNORMAL HIGH (ref 0.00–0.07)
Basophils Absolute: 0.1 10*3/uL (ref 0.0–0.1)
Basophils Relative: 1 %
Eosinophils Absolute: 0 10*3/uL (ref 0.0–0.5)
Eosinophils Relative: 0 %
HCT: 37.7 % (ref 36.0–46.0)
Hemoglobin: 12 g/dL (ref 12.0–15.0)
Immature Granulocytes: 3 %
Lymphocytes Relative: 9 %
Lymphs Abs: 1.7 10*3/uL (ref 0.7–4.0)
MCH: 28.5 pg (ref 26.0–34.0)
MCHC: 31.8 g/dL (ref 30.0–36.0)
MCV: 89.5 fL (ref 80.0–100.0)
Monocytes Absolute: 1.4 10*3/uL — ABNORMAL HIGH (ref 0.1–1.0)
Monocytes Relative: 8 %
Neutro Abs: 14.5 10*3/uL — ABNORMAL HIGH (ref 1.7–7.7)
Neutrophils Relative %: 79 %
Platelets: 407 10*3/uL — ABNORMAL HIGH (ref 150–400)
RBC: 4.21 MIL/uL (ref 3.87–5.11)
RDW: 14.3 % (ref 11.5–15.5)
WBC: 18.3 10*3/uL — ABNORMAL HIGH (ref 4.0–10.5)
nRBC: 0 % (ref 0.0–0.2)

## 2019-11-16 LAB — C-REACTIVE PROTEIN: CRP: 2.9 mg/dL — ABNORMAL HIGH (ref <1.0)

## 2019-11-16 LAB — GLUCOSE, CAPILLARY
Glucose-Capillary: 107 mg/dL — ABNORMAL HIGH (ref 70–99)
Glucose-Capillary: 218 mg/dL — ABNORMAL HIGH (ref 70–99)
Glucose-Capillary: 227 mg/dL — ABNORMAL HIGH (ref 70–99)
Glucose-Capillary: 233 mg/dL — ABNORMAL HIGH (ref 70–99)
Glucose-Capillary: 244 mg/dL — ABNORMAL HIGH (ref 70–99)
Glucose-Capillary: 87 mg/dL (ref 70–99)
Glucose-Capillary: 95 mg/dL (ref 70–99)

## 2019-11-16 LAB — BRAIN NATRIURETIC PEPTIDE: B Natriuretic Peptide: 56.9 pg/mL (ref 0.0–100.0)

## 2019-11-16 LAB — MAGNESIUM: Magnesium: 2.1 mg/dL (ref 1.7–2.4)

## 2019-11-16 LAB — PROCALCITONIN: Procalcitonin: 0.1 ng/mL

## 2019-11-16 LAB — MRSA PCR SCREENING: MRSA by PCR: NEGATIVE

## 2019-11-16 LAB — D-DIMER, QUANTITATIVE: D-Dimer, Quant: 3.77 ug/mL-FEU — ABNORMAL HIGH (ref 0.00–0.50)

## 2019-11-16 MED ORDER — FAMOTIDINE 20 MG PO TABS
20.0000 mg | ORAL_TABLET | Freq: Two times a day (BID) | ORAL | Status: DC
  Administered 2019-11-16 – 2019-11-18 (×5): 20 mg via ORAL
  Filled 2019-11-16 (×5): qty 1

## 2019-11-16 NOTE — Progress Notes (Signed)
Pt stable throughout this shift, transferred to bed safely, able to wean O2 for 4-5L ([place]), keeping sats above 95%. Pts CBG's tested through night, currently at 95 0400. Pts am meal with better results to follow. Pt slept well last pm, turning on stomach/side as directed. Pts #4 dose of remdesivir this am, possible to downgrade pt or d/c to home after last dose tomorrow.  Pt teaching r/t covid, rehabiliation and strengthening post infection, need for pacing with oxygen demands r/t covid, and need for teaching and demonstrations if pt will test bld sugars at home once discharged. Pt verbalized understanding of discussions and teaching. Report to be given at pts bed/window side, to view pt and complete report, with questions answered. Pt currently stable and unremarkable at this time. Handoff report awaits with next shift.

## 2019-11-16 NOTE — Progress Notes (Signed)
Pt's daughter Manuela Schwartz called and updated. Discharge planning discussed. Questions addressed. Family has no concerns at this time. Will continue plan of care.

## 2019-11-16 NOTE — Plan of Care (Signed)
  Problem: Education: Goal: Knowledge of risk factors and measures for prevention of condition will improve Outcome: Progressing   Problem: Coping: Goal: Psychosocial and spiritual needs will be supported Outcome: Progressing   Problem: Respiratory: Goal: Will maintain a patent airway Outcome: Progressing Goal: Complications related to the disease process, condition or treatment will be avoided or minimized Outcome: Progressing   

## 2019-11-16 NOTE — Progress Notes (Signed)
Physical Therapy Treatment Patient Details Name: Sylvia Turner MRN: [id number] DOB: September 29, 1936 Today's Date: 11/16/2019    History of Present Illness 84 year old female admitted from [facility] with COVID and hypoxia; PMH includes Polio (childhood), post herpetic neualgia, COPD, tobacco use (quit over 30 years ago), anxiety, RLS, hypothyroidism.    PT Comments    Pt did well with mobility this am. Pt was sitting in recliner reports having completed breathing exercises prior to therapist arrival. Pt able to ambulate approx 109ft x 2 w/ seated rest between each attempt. Pt ambulated on 6L/min via HFNC and was noted to desat to min of 82%. Pt also completed seated there ex in recliner and also incentive spirometer x 5 pulling 568ml, and flutter valve x 5.      Follow Up Recommendations  Home health PT     Equipment Recommendations  Rolling walker with 5" wheels    Recommendations for Other Services       Precautions / Restrictions Precautions Precautions: Fall;Other (comment) Precaution Comments: Monitor O2, desats quickly with activity Restrictions Weight Bearing Restrictions: No    Mobility  Bed Mobility               General bed mobility comments: pt found sitting in recliner at therapist arrival  Transfers Overall transfer level: Needs assistance Equipment used: Rolling walker (2 wheeled) Transfers: Sit to/from [employer] Transfers Sit to Stand: Min guard Stand pivot transfers: Min guard          Ambulation/Gait Ambulation/Gait assistance: Min guard;Min assist Gait Distance (Feet): 30 Feet Assistive device: Rolling walker (2 wheeled) Gait Pattern/deviations: Shuffle;Narrow base of support Gait velocity: decreased, very slow cadence   General Gait Details: 1 LOB with ambulation on turning able to correct with min a and cues   Stairs             Wheelchair Mobility    Modified Rankin (Stroke Patients Only)       Balance Overall  balance assessment: Mild deficits observed, not formally tested                                          Cognition Arousal/Alertness: Awake/alert Behavior During Therapy: WFL for tasks assessed/performed Overall Cognitive Status: Within Functional Limits for tasks assessed                                        Exercises General Exercises - Lower Extremity Ankle Circles/Pumps: AROM;Strengthening;10 reps;Both Long Arc Quad: AROM;Strengthening;Both;10 reps Hip Flexion/Marching: AROM;Strengthening;Both;10 reps Other Exercises Other Exercises: sit<>stand x 5  Other Exercises: incentive spirometer pulls 570ml Other Exercises: flutter valve    General Comments General comments (skin integrity, edema, etc.): ambulated approx 61fft x 2 w/ seated rest between, using RW and min to min guard assist pt was on 6L/min via HFNC, min desat noted t be 82 %      Pertinent Vitals/Pain Pain Assessment: Faces Faces Pain Scale: Hurts a little bit Pain Location: w/ increased work of breathing    Home Living                      Prior Function            PT Goals (current goals can now be found in the  care plan section) Acute Rehab PT Goals Patient Stated Goal: states is ready to get moving, was very active prior to hospitalization PT Goal Formulation: With patient Time For Goal Achievement: 11/28/19 Potential to Achieve Goals: Good Progress towards PT goals: Progressing toward goals    Frequency    Min 3X/week      PT Plan Current plan remains appropriate    Co-evaluation              AM-PAC PT "6 Clicks" Mobility   Outcome Measure  Help needed turning from your back to your side while in a flat bed without using bedrails?: A Little Help needed moving from lying on your back to sitting on the side of a flat bed without using bedrails?: A Little Help needed moving to and from a bed to a chair (including a wheelchair)?: A  Little Help needed standing up from a chair using your arms (e.g., wheelchair or bedside chair)?: A Little Help needed to walk in hospital room?: A Little Help needed climbing 3-5 steps with a railing? : A Lot 6 Click Score: 17    End of Session Equipment Utilized During Treatment: Oxygen Activity Tolerance: Patient limited by fatigue;Treatment limited secondary to medical complications (Comment) Patient left: in chair;with call bell/phone within reach Nurse Communication: Mobility status PT Visit Diagnosis: Muscle weakness (generalized) (M62.81)     Time: [id number] PT Time Calculation (min) (ACUTE ONLY): 33 min  Charges:  $Gait Training: 8-22 mins $Therapeutic Exercise: 8-22 mins                     Horald Chestnut, PT    Delford Field 11/16/2019, 12:24 PM

## 2019-11-16 NOTE — Progress Notes (Signed)
PROGRESS NOTE                                                                                                                                                                                                             Patient Demographics:    Sylvia Turner, is a 84 y.o. female, DOB - [birth year], TXH:[id number]  Outpatient Primary MD for the patient is Asencion Noble, MD    LOS - 3  Admit date - 11/13/2019    Chief Complaint  Patient presents with  . Shortness of Breath       Brief Narrative - Sylvia Turner is a 84 y.o. female with past medical history significant for postherpetic neuralgia, COPD (no requiring oxygen and only using as needed albuterol), prior history of tobacco use, hypothyroidism, restless leg syndrome and insomnia; who was diagnosed with coronavirus infection on 11/02/19 she was diagnosed with acute hypoxic respiratory failure due to COVID-19 infection she was admitted to [facility] and then transferred to [facility].   Subjective:    Sylvia Turner today has, No headache, No chest pain, No abdominal pain , he does report dyspnea, mainly upon exertion, as well complains of cough.   Assessment  & Plan :     Acute Hypoxic Resp. Failure due to Acute Covid 19 Viral Pneumonitis during the ongoing 2020 Covid 19 Pandemic  -She had severe disease, requiring 15 L high flow nasal cannula initially, this is gradually improving, she remains on 6 L nasal cannula today. -Encouraged her to ambulate with staff, use incentive spirometry, flutter valve, out of bed to chair -Continue with IV remdesivir, day 4 out of 5 -Continue with IV steroids. -Received Actemra 1/11 -Continue to trend inflammatory markers, trending down which is reassuring  SpO2: 90 % O2 Flow Rate (L/min): 6 L/min FiO2 (%): 100 %  Recent Labs  Lab 11/13/19 1236 11/14/19 0350 11/15/19 0404 11/16/19 0244  CRP 28.0* 24.4* 8.2* 2.9*  DDIMER 15.26*  7.22* 5.08* 3.77*  FERRITIN 413* 375*  --   --   BNP 121.0* 114.6* 71.7 56.9  PROCALCITON 0.27 0.10 0.11 0.10    Hepatic Function Latest Ref Rng & Units 11/16/2019 11/15/2019 11/14/2019  Total Protein 6.5 - 8.1 g/dL 5.1(L) 5.4(L) 5.9(L)  Albumin 3.5 - 5.0 g/dL 2.3(L) 2.4(L) 2.5(L)  AST 15 - 41 U/L 29 30 49(H)  ALT 0 - 44 U/L '27 26 30  ' Alk Phosphatase 38 - 126 U/L 74 81 92  Total Bilirubin 0.3 - 1.2 mg/dL 0.8 0.3 0.4     Acute diastolic CHF -2D echo with grade 1 diastolic dysfunction, volume overload on physical exam, continue with IV diuresis.  Status appears to be improving, continue with Lasix IV 40 mg daily  elevated troponin -Non-ACS pattern, in the setting of demand ischemia, 2D echo with no regional wall motion abnormality. - No chest pain, EKG nonacute, echo pending.  Symptom-free will monitor.  Started  on baby aspirin.  Hypothyroidism.   - Continue home dose Synthroid, TSH elevated at 7, increased Synthroid to 125 mcg daily  Anxiety.  - As needed Xanax.  History of postherpetic neuralgia and restless leg syndrome.  On combination of Neurontin and Mirapex.    Insomnia.  Trazodone nightly.    Hyperglycemia steroid-induced.  Check A1c, sliding scale for now.  Lab Results  Component Value Date   HGBA1C 6.1 (H) 11/14/2019   CBG (last 3)  Recent Labs    11/16/19 0414 11/16/19 0700 11/16/19 1108  GLUCAP 95 87 227*    Condition -   Guarded  Family Communication  : Daughter on 11/16/2019  Code Status :  Full  Diet :   Diet Order            Diet regular Room service appropriate? Yes; Fluid consistency: Thin  Diet effective now               Disposition Plan  :  Home, she is still on significant amount of oxygen, and still in need of IV diuresis  Consults  :  None  Procedures  :    TTE   PUD Prophylaxis : None  DVT Prophylaxis  :  Lovenox added  Lab Results  Component Value Date   PLT 407 (H) 11/16/2019    Inpatient Medications  Scheduled  Meds: . amLODipine  10 mg Oral Daily  . vitamin C  500 mg Oral Daily  . aspirin  81 mg Oral Daily  . enoxaparin (LOVENOX) injection  40 mg Subcutaneous Q12H  . furosemide  40 mg Intravenous Daily  . gabapentin  300 mg Oral QHS  . insulin aspart  0-9 Units Subcutaneous Q4H  . insulin detemir  0.075 Units/kg Subcutaneous BID  . Ipratropium-Albuterol  1 puff Inhalation Q6H  . levothyroxine  125 mcg Oral QAC breakfast  . methylPREDNISolone (SOLU-MEDROL) injection  60 mg Intravenous Daily  . pramipexole  0.5 mg Oral QPM  . zinc sulfate  220 mg Oral Daily   Continuous Infusions: . remdesivir 100 mg in NS 100 mL 100 mg (11/16/19 0823)   PRN Meds:.acetaminophen, ALPRAZolam, chlorpheniramine-HYDROcodone, guaiFENesin-dextromethorphan, ondansetron **OR** ondansetron (ZOFRAN) IV, traZODone  Antibiotics  :    Anti-infectives (From admission, onward)   Start     Dose/Rate Route Frequency Ordered Stop   11/14/19 1000  remdesivir 100 mg in sodium chloride 0.9 % 100 mL IVPB     100 mg 200 mL/hr over 30 Minutes Intravenous Daily 11/13/19 1509 11/18/19 0959   11/13/19 1515  remdesivir 200 mg in sodium chloride 0.9% 250 mL IVPB     200 mg 580 mL/hr over 30 Minutes Intravenous Once 11/13/19 1509 11/13/19 1655       Time Spent in minutes  27   Phillips Climes M.D on 11/16/2019 at 12:19 PM  To page go to [email or web address] - password [email or web address]  [facility] -  Office  [phone]   See all Orders from today for further details    Objective:   Vitals:   11/16/19 0415 11/16/19 0500 11/16/19 0600 11/16/19 0800  BP:    (!) 147/78  Pulse:  70 82 90  Resp:  19 (!) 25 (!) 22  Temp:    98 F (36.7 C)  TempSrc:    Oral  SpO2:  94% 96% 90%  Weight: 66.3 kg     Height:        Wt Readings from Last 3 Encounters:  11/16/19 66.3 kg  10/17/14 63.4 kg     Intake/Output Summary (Last 24 hours) at 11/16/2019 1219 Last data filed at 11/16/2019 1100 Gross per 24 hour  Intake 1300 ml  Output 300  ml  Net 1000 ml     Physical Exam  Awake Alert, Oriented X 3, No new F.N deficits, Normal affect Symmetrical Chest wall movement, Good air movement bilaterally, bibasilar crackles RRR,No Gallops,Rubs or new Murmurs, No Parasternal Heave +ve B.Sounds, Abd Soft, No tenderness, No rebound - guarding or rigidity. No Cyanosis, Clubbing, +1 edema, No new Rash or bruise        Data Review:    CBC Recent Labs  Lab 11/13/19 1236 11/14/19 0350 11/15/19 0404 11/16/19 0244  WBC 19.1* 17.0* 16.0* 18.3*  HGB 13.8 13.6 12.3 12.0  HCT 42.0 42.5 37.9 37.7  PLT 249 279 372 407*  MCV 88.8 89.9 88.1 89.5  MCH 29.2 28.8 28.6 28.5  MCHC 32.9 32.0 32.5 31.8  RDW 14.3 14.3 14.4 14.3  LYMPHSABS 2.1 1.5 1.6 1.7  MONOABS 3.4* 2.1* 0.8 1.4*  EOSABS 0.2 0.0 0.0 0.0  BASOSABS 0.0 0.2* 0.1 0.1    Chemistries  Recent Labs  Lab 11/13/19 1236 11/14/19 0350 11/15/19 0404 11/16/19 0244  NA 135 139 136 139  K 5.4* 4.1 4.0 3.7  CL 101 102 102 104  CO2 '25 22 25 26  ' GLUCOSE 104* 87 145* 99  BUN '11 18 20 ' 26*  CREATININE 0.60 0.58 0.52 0.49  CALCIUM 7.8* 7.9* 7.9* 7.8*  MG  --  1.9 2.2 2.1  AST 64* 49* 30 29  ALT 36 '30 26 27  ' ALKPHOS 88 92 81 74  BILITOT 1.8* 0.4 0.3 0.8   ------------------------------------------------------------------------------------------------------------------ Recent Labs    11/13/19 1236  TRIG 109    Lab Results  Component Value Date   HGBA1C 6.1 (H) 11/14/2019   ------------------------------------------------------------------------------------------------------------------ Recent Labs    11/14/19 0350  TSH 7.446*    Cardiac Enzymes No results for input(s): CKMB, TROPONINI, MYOGLOBIN in the last 168 hours.  Invalid input(s): CK ------------------------------------------------------------------------------------------------------------------    Component Value Date/Time   BNP 56.9 11/16/2019 0244    Micro Results Recent Results (from the  past 240 hour(s))  Blood Culture (routine x 2)     Status: None (Preliminary result)   Collection Time: 11/13/19 12:36 PM   Specimen: BLOOD RIGHT FOREARM  Result Value Ref Range Status   Specimen Description BLOOD RIGHT FOREARM  Final   Special Requests   Final    BOTTLES DRAWN AEROBIC AND ANAEROBIC Blood Culture results may not be optimal due to an inadequate volume of blood received in culture bottles   Culture   Final    NO GROWTH 3 DAYS Performed at [facility], [address] [place] [address]    Report Status PENDING  Incomplete  Blood Culture (routine x 2)     Status: None (Preliminary result)  Collection Time: 11/13/19 12:41 PM   Specimen: Right Antecubital; Blood  Result Value Ref Range Status   Specimen Description RIGHT ANTECUBITAL  Final   Special Requests   Final    BOTTLES DRAWN AEROBIC AND ANAEROBIC Blood Culture adequate volume   Culture   Final    NO GROWTH 3 DAYS Performed at [facility], [address], [place], [place] [address]    Report Status PENDING  Incomplete    Radiology Reports DG Chest Port 1 View  Result Date: 11/13/2019 CLINICAL DATA:  Pt c/o worsening SOB. Pt diagnosed with COVID 12/31. Hx former smoker cough, sob, covid + EXAM: PORTABLE CHEST 1 VIEW COMPARISON:  01/17/2008 FINDINGS: Normal cardiac silhouette. There is patchy bilateral peripheral nodular airspace densities. No pleural fluid. No pneumothorax. No acute osseous abnormality. IMPRESSION: Patchy peripheral nodular airspace disease consistent with COVID viral pneumonia. Electronically Signed   By: Suzy Bouchard M.D.   On: 11/13/2019 12:58   ECHOCARDIOGRAM COMPLETE  Result Date: 11/14/2019   ECHOCARDIOGRAM REPORT   Patient Name:   NEIRA BENTSEN Date of Exam: 11/14/2019 Medical Rec #:  [id number]      Height:       62.0 in Accession #:    [id number]     Weight:       147.0 lb Date of Birth:  [birth year]      BSA:          1.68 m Patient Age:    78 years       BP:           149/77  mmHg Patient Gender: F              HR:           80 bpm. Exam Location:  Inpatient Procedure: 2D Echo, Cardiac Doppler and Color Doppler Indications:    Dyspnea  History:        Patient has no prior history of Echocardiogram examinations.                 COPD, Signs/Symptoms:Shortness of Breath; Risk Factors:Former                 Smoker. Covid positive.  Sonographer:    Dustin Flock Referring Phys: [place]  1. Left ventricular ejection fraction, by visual estimation, is 65 to 70%. The left ventricle has hyperdynamic function. There is no left ventricular hypertrophy.  2. Left ventricular diastolic parameters are consistent with Grade I diastolic dysfunction (impaired relaxation).  3. The left ventricle has no regional wall motion abnormalities.  4. Global right ventricle has normal systolic function.The right ventricular size is normal. No increase in right ventricular wall thickness.  5. Left atrial size was normal.  6. Right atrial size was normal.  7. The mitral valve is normal in structure. No evidence of mitral valve regurgitation.  8. The tricuspid valve is normal in structure.  9. The aortic valve is normal in structure. Aortic valve regurgitation is not visualized. 10. The pulmonic valve was not well visualized. Pulmonic valve regurgitation is not visualized. 11. Moderately elevated pulmonary artery systolic pressure. FINDINGS  Left Ventricle: Left ventricular ejection fraction, by visual estimation, is 65 to 70%. The left ventricle has hyperdynamic function. The left ventricle has no regional wall motion abnormalities. The left ventricular internal cavity size was the left ventricle is normal in size. There is no left ventricular hypertrophy. Left ventricular diastolic parameters are consistent with Grade I diastolic dysfunction (impaired  relaxation). Normal left atrial pressure. Right Ventricle: The right ventricular size is normal. No increase in right ventricular wall  thickness. Global RV systolic function is has normal systolic function. The tricuspid regurgitant velocity is 3.33 m/s, and with an assumed right atrial pressure  of 3 mmHg, the estimated right ventricular systolic pressure is moderately elevated at 47.4 mmHg. Left Atrium: Left atrial size was normal in size. Right Atrium: Right atrial size was normal in size Pericardium: There is no evidence of pericardial effusion. Mitral Valve: The mitral valve is normal in structure. No evidence of mitral valve regurgitation. Tricuspid Valve: The tricuspid valve is normal in structure. Tricuspid valve regurgitation mild-moderate. Aortic Valve: The aortic valve is normal in structure. Aortic valve regurgitation is not visualized. Pulmonic Valve: The pulmonic valve was not well visualized. Pulmonic valve regurgitation is not visualized. Pulmonic regurgitation is not visualized. Aorta: The aortic root and ascending aorta are structurally normal, with no evidence of dilitation. IAS/Shunts: No atrial level shunt detected by color flow Doppler.  LEFT VENTRICLE PLAX 2D LVIDd:         3.06 cm  Diastology LVIDs:         2.16 cm  LV e' lateral:   7.18 cm/s LV PW:         1.09 cm  LV E/e' lateral: 8.4 LV IVS:        1.12 cm  LV e' medial:    7.29 cm/s LVOT diam:     2.00 cm  LV E/e' medial:  8.2 LV SV:         21 ml LV SV Index:   12.32 LVOT Area:     3.14 cm  RIGHT VENTRICLE RV S prime:     20.64 cm/s TAPSE (M-mode): 2.6 cm LEFT ATRIUM             Index       RIGHT ATRIUM           Index LA diam:        2.70 cm 1.61 cm/m  RA Area:     19.40 cm LA Vol (A2C):   34.9 ml 20.81 ml/m RA Volume:   59.10 ml  35.23 ml/m LA Vol (A4C):   37.4 ml 22.30 ml/m LA Biplane Vol: 38.6 ml 23.01 ml/m  AORTIC VALVE LVOT Vmax:   102.00 cm/s LVOT Vmean:  75.000 cm/s LVOT VTI:    0.206 m  AORTA Ao Root diam: 2.90 cm MITRAL VALVE                        TRICUSPID VALVE MV Area (PHT): 5.38 cm             TR Peak grad:   44.4 mmHg MV PHT:        40.89 msec            TR Vmax:        333.00 cm/s MV Decel Time: 141 msec MV E velocity: 60.10 cm/s 103 cm/s  SHUNTS MV A velocity: 68.40 cm/s 70.3 cm/s Systemic VTI:  0.21 m MV E/A ratio:  0.88       1.5       Systemic Diam: 2.00 cm  Dani Gobble Croitoru MD Electronically signed by Sanda Klein MD Signature Date/Time: 11/14/2019/1:29:02 PM    Final

## 2019-11-17 LAB — CBC WITH DIFFERENTIAL/PLATELET
Abs Immature Granulocytes: 0.51 10*3/uL — ABNORMAL HIGH (ref 0.00–0.07)
Basophils Absolute: 0.1 10*3/uL (ref 0.0–0.1)
Basophils Relative: 0 %
Eosinophils Absolute: 0 10*3/uL (ref 0.0–0.5)
Eosinophils Relative: 0 %
HCT: 37.2 % (ref 36.0–46.0)
Hemoglobin: 12.3 g/dL (ref 12.0–15.0)
Immature Granulocytes: 4 %
Lymphocytes Relative: 10 %
Lymphs Abs: 1.4 10*3/uL (ref 0.7–4.0)
MCH: 29.1 pg (ref 26.0–34.0)
MCHC: 33.1 g/dL (ref 30.0–36.0)
MCV: 87.9 fL (ref 80.0–100.0)
Monocytes Absolute: 1.1 10*3/uL — ABNORMAL HIGH (ref 0.1–1.0)
Monocytes Relative: 8 %
Neutro Abs: 11.1 10*3/uL — ABNORMAL HIGH (ref 1.7–7.7)
Neutrophils Relative %: 78 %
Platelets: 417 10*3/uL — ABNORMAL HIGH (ref 150–400)
RBC: 4.23 MIL/uL (ref 3.87–5.11)
RDW: 14.2 % (ref 11.5–15.5)
WBC: 14.2 10*3/uL — ABNORMAL HIGH (ref 4.0–10.5)
nRBC: 0 % (ref 0.0–0.2)

## 2019-11-17 LAB — GLUCOSE, CAPILLARY
Glucose-Capillary: 92 mg/dL (ref 70–99)
Glucose-Capillary: 85 mg/dL (ref 70–99)
Glucose-Capillary: 70 mg/dL (ref 70–99)
Glucose-Capillary: 170 mg/dL — ABNORMAL HIGH (ref 70–99)
Glucose-Capillary: 235 mg/dL — ABNORMAL HIGH (ref 70–99)
Glucose-Capillary: 175 mg/dL — ABNORMAL HIGH (ref 70–99)

## 2019-11-17 LAB — COMPREHENSIVE METABOLIC PANEL
ALT: 31 U/L (ref 0–44)
AST: 29 U/L (ref 15–41)
Albumin: 2.3 g/dL — ABNORMAL LOW (ref 3.5–5.0)
Alkaline Phosphatase: 73 U/L (ref 38–126)
Anion gap: 8 (ref 5–15)
BUN: 24 mg/dL — ABNORMAL HIGH (ref 8–23)
CO2: 27 mmol/L (ref 22–32)
Calcium: 7.9 mg/dL — ABNORMAL LOW (ref 8.9–10.3)
Chloride: 103 mmol/L (ref 98–111)
Creatinine, Ser: 0.54 mg/dL (ref 0.44–1.00)
GFR calc Af Amer: >60 mL/min (ref >60)
GFR calc non Af Amer: >60 mL/min (ref >60)
Glucose, Bld: 89 mg/dL (ref 70–99)
Potassium: 3.6 mmol/L (ref 3.5–5.1)
Sodium: 138 mmol/L (ref 135–145)
Total Bilirubin: 0.5 mg/dL (ref 0.3–1.2)
Total Protein: 5 g/dL — ABNORMAL LOW (ref 6.5–8.1)

## 2019-11-17 LAB — C-REACTIVE PROTEIN: CRP: 1.5 mg/dL — ABNORMAL HIGH (ref <1.0)

## 2019-11-17 LAB — BRAIN NATRIURETIC PEPTIDE: B Natriuretic Peptide: 42.6 pg/mL (ref 0.0–100.0)

## 2019-11-17 LAB — MAGNESIUM: Magnesium: 2.3 mg/dL (ref 1.7–2.4)

## 2019-11-17 LAB — D-DIMER, QUANTITATIVE: D-Dimer, Quant: 3.32 ug/mL-FEU — ABNORMAL HIGH (ref 0.00–0.50)

## 2019-11-17 MED ORDER — ENOXAPARIN SODIUM 40 MG/0.4ML ~~LOC~~ SOLN
40.0000 mg | SUBCUTANEOUS | Status: DC
  Administered 2019-11-18: 40 mg via SUBCUTANEOUS
  Filled 2019-11-17: qty 0.4

## 2019-11-17 MED ORDER — SALINE SPRAY 0.65 % NA SOLN
1.0000 | NASAL | Status: DC | PRN
  Filled 2019-11-17: qty 44

## 2019-11-17 MED ORDER — LIP MEDEX EX OINT
TOPICAL_OINTMENT | CUTANEOUS | Status: DC | PRN
  Filled 2019-11-17: qty 7

## 2019-11-17 MED ORDER — FUROSEMIDE 10 MG/ML IJ SOLN
40.0000 mg | Freq: Once | INTRAMUSCULAR | Status: AC
  Administered 2019-11-17: 40 mg via INTRAVENOUS
  Filled 2019-11-17: qty 4

## 2019-11-17 NOTE — Plan of Care (Signed)
  Problem: Education: Goal: Knowledge of risk factors and measures for prevention of condition will improve Outcome: Progressing   Problem: Coping: Goal: Psychosocial and spiritual needs will be supported Outcome: Progressing   Problem: Respiratory: Goal: Will maintain a patent airway Outcome: Progressing Goal: Complications related to the disease process, condition or treatment will be avoided or minimized Outcome: Progressing   

## 2019-11-17 NOTE — Progress Notes (Signed)
Pt declined ambulation.  

## 2019-11-17 NOTE — Progress Notes (Signed)
SATURATION QUALIFICATIONS: (This note is used to comply with regulatory documentation for home oxygen)  Patient Saturations on Room Air at Rest = 93%  Patient Saturations on Room Air while Ambulating = 86%  Patient Saturations on 2 Liters of oxygen while Ambulating = 90%  Please briefly explain why patient needs home oxygen: Desaturation with activity

## 2019-11-17 NOTE — TOC Initial Note (Signed)
Transition of Care [facility]) - Initial/Assessment Note    Patient Details  Name: Sylvia Turner MRN: [id number] Date of Birth: Aug 22, 1936  Transition of Care [facility]) CM/SW Contact:    Shade Flood, LCSW Phone Number: 11/17/2019, 3:26 PM  Clinical Narrative:                  Pt from home with daughter. PT recommending PT HH at dc. Spoke with pt by phone and she is agreeable. Spoke with pt's daughter by phone as well at pt request. Daughter agreeable to [facility] for pt.   Arranged with [facility]. Pt stated MD informed her that earliest dc would be Sunday. Pt may need Home O2.   Weekend TOC will be available if needed.  Expected Discharge Plan: [place] Barriers to Discharge: Continued Medical Work up   Patient Goals and CMS Choice   CMS Medicare.gov Compare Post Acute Care list provided to:: Patient Choice offered to / list presented to : Patient  Expected Discharge Plan and Services Expected Discharge Plan: [place] In-house Referral: Clinical Social Work   Post Acute Care Choice: [place] arrangements for the past 2 months: [place]: PT, OT, RN [place] Agency: [facility] [employer] [facility] [employer] Date [place]: 11/17/19   Representative spoke with at [place]: [place]  Prior Living Arrangements/Services Living arrangements for the past 2 months: [place] with:: Adult Children Patient language and need for interpreter reviewed:: Yes Do you feel safe going back to the place where you live?: Yes      Need for Family Participation in Patient Care: Yes (Comment) Care giver support system in place?: Yes (comment)   Criminal Activity/Legal Involvement Pertinent to Current Situation/Hospitalization: No - Comment as needed  Activities of Daily Living Home Assistive Devices/Equipment: None ADL Screening (condition at time of admission) Patient's  cognitive ability adequate to safely complete daily activities?: Yes Is the patient deaf or have difficulty hearing?: Yes Does the patient have difficulty seeing, even when wearing glasses/contacts?: No Does the patient have difficulty concentrating, remembering, or making decisions?: No Patient able to express need for assistance with ADLs?: Yes Does the patient have difficulty dressing or bathing?: No Independently performs ADLs?: Yes (appropriate for developmental age) Does the patient have difficulty walking or climbing stairs?: No Weakness of Legs: Left Weakness of Arms/Hands: None  Permission Sought/Granted                  Emotional Assessment       Orientation: : Oriented to Self, Oriented to Place, Oriented to  Time, Oriented to Situation Alcohol / Substance Use: Not Applicable Psych Involvement: No (comment)  Admission diagnosis:  Acute respiratory failure with hypoxia ([place]) [J96.01] COVID-19 virus infection [U07.1] Acute respiratory disease due to COVID-19 virus [U07.1, J06.9] Patient Active Problem List   Diagnosis Date Noted  . Acute respiratory disease due to COVID-19 virus 11/13/2019  . Postherpetic neuralgia at T3-T5 level 10/17/2014   PCP:  Asencion Noble, MD Pharmacy:   [place], [place] - [place] [address] [place] [address] [place] [address] Phone: [phone] Fax: [phone]     Social Determinants of Health (SDOH) Interventions    Readmission Risk Interventions Readmission Risk Prevention Plan 11/17/2019  Medication Screening Complete  Transportation Screening Complete  Some recent data might be hidden

## 2019-11-17 NOTE — Progress Notes (Signed)
PROGRESS NOTE                                                                                                                                                                                                             Patient Demographics:    Sylvia Turner, is a 84 y.o. female, DOB - 03-Sep-1936, [id number]:[id number]  Outpatient Primary MD for the patient is Asencion Noble, MD    LOS - 4  Admit date - 11/13/2019    Chief Complaint  Patient presents with  . Shortness of Breath       Brief Narrative - Sylvia Turner is a 84 y.o. female with past medical history significant for postherpetic neuralgia, COPD (no requiring oxygen and only using as needed albuterol), prior history of tobacco use, hypothyroidism, restless leg syndrome and insomnia; who was diagnosed with coronavirus infection on 11/02/19 she was diagnosed with acute hypoxic respiratory failure due to COVID-19 infection she was admitted to [facility] and then transferred to [facility].   Subjective:    Sylvia Turner today has, No headache, No chest pain, No abdominal pain , he does report dyspnea, mainly upon exertion, as well complains of cough.   Assessment  & Plan :     Acute Hypoxic Resp. Failure due to Acute Covid 19 Viral Pneumonitis during the ongoing 2020 Covid 19 Pandemic  -She had severe disease, requiring 15 L high flow nasal cannula initially, slowly improving, this morning she is on 5 L nasal cannula, and did well with ambulation with nurse earlier today . -Encouraged her to ambulate with staff, use incentive spirometry, flutter valve, out of bed to chair -Continue with IV remdesivir, day 4 out of 5 -Continue with IV steroids. -Received Actemra 1/11. -Continue to trend inflammatory markers, trending down which is reassuring.  SpO2: 94 % O2 Flow Rate (L/min): 4 L/min FiO2 (%): 100 %  Recent Labs  Lab 11/13/19 1236 11/14/19 0350 11/15/19 0404  11/16/19 0244 11/17/19 0059  CRP 28.0* 24.4* 8.2* 2.9* 1.5*  DDIMER 15.26* 7.22* 5.08* 3.77* 3.32*  FERRITIN 413* 375*  --   --   --   BNP 121.0* 114.6* 71.7 56.9 42.6  PROCALCITON 0.27 0.10 0.11 0.10  --     Hepatic Function Latest Ref Rng & Units 11/17/2019 11/16/2019 11/15/2019  Total Protein 6.5 - 8.1 g/dL 5.0(L)  5.1(L) 5.4(L)  Albumin 3.5 - 5.0 g/dL 2.3(L) 2.3(L) 2.4(L)  AST 15 - 41 U/L '29 29 30  ' ALT 0 - 44 U/L '31 27 26  ' Alk Phosphatase 38 - 126 U/L 73 74 81  Total Bilirubin 0.3 - 1.2 mg/dL 0.5 0.8 0.3     Acute diastolic CHF -2D echo with grade 1 diastolic dysfunction, volume overload on physical exam, continue with IV diuresis.  Status appears to be improving, continue with Lasix IV 40 mg daily, give extra dose of Lasix today.  elevated troponin -Non-ACS pattern, in the setting of demand ischemia, 2D echo with no regional wall motion abnormality. - No chest pain, EKG nonacute, echo pending.  Symptom-free will monitor.  Started  on baby aspirin.  Hypothyroidism.   - Continue home dose Synthroid, TSH elevated at 7, increased Synthroid to 125 mcg daily  Anxiety.  - As needed Xanax.  History of postherpetic neuralgia and restless leg syndrome.  On combination of Neurontin and Mirapex.    Insomnia.  Trazodone nightly.    Hyperglycemia steroid-induced.  Check A1c, sliding scale for now.  Lab Results  Component Value Date   HGBA1C 6.1 (H) 11/14/2019   CBG (last 3)  Recent Labs    11/17/19 0419 11/17/19 0747 11/17/19 1209  GLUCAP 85 70 170*    Condition -   Guarded  Family Communication  : Daughter on 11/16/2019  Code Status :  Full  Diet :   Diet Order            Diet regular Room service appropriate? Yes; Fluid consistency: Thin  Diet effective now               Disposition Plan  :  Home, she is still on significant amount of oxygen, and still in need of IV diuresis  Consults  :  None  Procedures  :    TTE   PUD Prophylaxis : None  DVT  Prophylaxis  :  Lovenox added  Lab Results  Component Value Date   PLT 417 (H) 11/17/2019    Inpatient Medications  Scheduled Meds: . amLODipine  10 mg Oral Daily  . vitamin C  500 mg Oral Daily  . aspirin  81 mg Oral Daily  . [START ON 11/18/2019] enoxaparin (LOVENOX) injection  40 mg Subcutaneous Q24H  . famotidine  20 mg Oral BID  . furosemide  40 mg Intravenous Daily  . gabapentin  300 mg Oral QHS  . insulin aspart  0-9 Units Subcutaneous Q4H  . insulin detemir  0.075 Units/kg Subcutaneous BID  . Ipratropium-Albuterol  1 puff Inhalation Q6H  . levothyroxine  125 mcg Oral QAC breakfast  . methylPREDNISolone (SOLU-MEDROL) injection  60 mg Intravenous Daily  . pramipexole  0.5 mg Oral QPM  . zinc sulfate  220 mg Oral Daily   Continuous Infusions:  PRN Meds:.acetaminophen, ALPRAZolam, chlorpheniramine-HYDROcodone, guaiFENesin-dextromethorphan, lip balm, ondansetron **OR** ondansetron (ZOFRAN) IV, sodium chloride, traZODone  Antibiotics  :    Anti-infectives (From admission, onward)   Start     Dose/Rate Route Frequency Ordered Stop   11/14/19 1000  remdesivir 100 mg in sodium chloride 0.9 % 100 mL IVPB     100 mg 200 mL/hr over 30 Minutes Intravenous Daily 11/13/19 1509 11/17/19 0930   11/13/19 1515  remdesivir 200 mg in sodium chloride 0.9% 250 mL IVPB     200 mg 580 mL/hr over 30 Minutes Intravenous Once 11/13/19 1509 11/13/19 1655       Time  Spent in minutes  72   Phillips Climes M.D on 11/17/2019 at 3:08 PM  To page go to [email or web address] - password [facility]  [facility] -  Office  [phone]   See all Orders from today for further details    Objective:   Vitals:   11/16/19 2300 11/17/19 0420 11/17/19 0428 11/17/19 0823  BP: (!) 124/56 (!) 119/58  120/70  Pulse: 77 87  86  Resp: '17 20  20  ' Temp: 98.4 F (36.9 C) 97.9 F (36.6 C)  97.6 F (36.4 C)  TempSrc: Oral Oral  Oral  SpO2: 96% 96%  94%  Weight:   66.6 kg   Height:        Wt Readings  from Last 3 Encounters:  11/17/19 66.6 kg  10/17/14 63.4 kg     Intake/Output Summary (Last 24 hours) at 11/17/2019 1508 Last data filed at 11/17/2019 0700 Gross per 24 hour  Intake 360 ml  Output 400 ml  Net -40 ml     Physical Exam  Awake Alert, Oriented X 3, No new F.N deficits, Normal affect Symmetrical Chest wall movement, Good air movement bilaterally, bibasilar crackles RRR,No Gallops,Rubs or new Murmurs, No Parasternal Heave +ve B.Sounds, Abd Soft, No tenderness, No rebound - guarding or rigidity. No Cyanosis, Clubbing , +1 edema, No new Rash or bruise       Data Review:    CBC Recent Labs  Lab 11/13/19 1236 11/14/19 0350 11/15/19 0404 11/16/19 0244 11/17/19 0059  WBC 19.1* 17.0* 16.0* 18.3* 14.2*  HGB 13.8 13.6 12.3 12.0 12.3  HCT 42.0 42.5 37.9 37.7 37.2  PLT 249 279 372 407* 417*  MCV 88.8 89.9 88.1 89.5 87.9  MCH 29.2 28.8 28.6 28.5 29.1  MCHC 32.9 32.0 32.5 31.8 33.1  RDW 14.3 14.3 14.4 14.3 14.2  LYMPHSABS 2.1 1.5 1.6 1.7 1.4  MONOABS 3.4* 2.1* 0.8 1.4* 1.1*  EOSABS 0.2 0.0 0.0 0.0 0.0  BASOSABS 0.0 0.2* 0.1 0.1 0.1    Chemistries  Recent Labs  Lab 11/13/19 1236 11/14/19 0350 11/15/19 0404 11/16/19 0244 11/17/19 0059  NA 135 139 136 139 138  K 5.4* 4.1 4.0 3.7 3.6  CL 101 102 102 104 103  CO2 '25 22 25 26 27  ' GLUCOSE 104* 87 145* 99 89  BUN '11 18 20 ' 26* 24*  CREATININE 0.60 0.58 0.52 0.49 0.54  CALCIUM 7.8* 7.9* 7.9* 7.8* 7.9*  MG  --  1.9 2.2 2.1 2.3  AST 64* 49* '30 29 29  ' ALT 36 '30 26 27 31  ' ALKPHOS 88 92 81 74 73  BILITOT 1.8* 0.4 0.3 0.8 0.5   ------------------------------------------------------------------------------------------------------------------ No results for input(s): CHOL, HDL, LDLCALC, TRIG, CHOLHDL, LDLDIRECT in the last 72 hours.  Lab Results  Component Value Date   HGBA1C 6.1 (H) 11/14/2019    ------------------------------------------------------------------------------------------------------------------ No results for input(s): TSH, T4TOTAL, T3FREE, THYROIDAB in the last 72 hours.  Invalid input(s): FREET3  Cardiac Enzymes No results for input(s): CKMB, TROPONINI, MYOGLOBIN in the last 168 hours.  Invalid input(s): CK ------------------------------------------------------------------------------------------------------------------    Component Value Date/Time   BNP 42.6 11/17/2019 0059    Micro Results Recent Results (from the past 240 hour(s))  Blood Culture (routine x 2)     Status: None (Preliminary result)   Collection Time: 11/13/19 12:36 PM   Specimen: BLOOD RIGHT FOREARM  Result Value Ref Range Status   Specimen Description BLOOD RIGHT FOREARM  Final   Special Requests   Final  BOTTLES DRAWN AEROBIC AND ANAEROBIC Blood Culture results may not be optimal due to an inadequate volume of blood received in culture bottles   Culture   Final    NO GROWTH 4 DAYS Performed at [facility], [address] [place], [place] [address]    Report Status PENDING  Incomplete  Blood Culture (routine x 2)     Status: None (Preliminary result)   Collection Time: 11/13/19 12:41 PM   Specimen: Right Antecubital; Blood  Result Value Ref Range Status   Specimen Description RIGHT ANTECUBITAL  Final   Special Requests   Final    BOTTLES DRAWN AEROBIC AND ANAEROBIC Blood Culture adequate volume   Culture   Final    NO GROWTH 4 DAYS Performed at [facility], [address], [place], [place] [address]    Report Status PENDING  Incomplete  MRSA PCR Screening     Status: None   Collection Time: 11/16/19  5:58 PM   Specimen: Nasal Mucosa; Nasopharyngeal  Result Value Ref Range Status   MRSA by PCR NEGATIVE NEGATIVE Final    Comment:        The GeneXpert MRSA Assay (FDA approved for NASAL specimens only), is one component of a comprehensive MRSA colonization surveillance  program. It is not intended to diagnose MRSA infection nor to guide or monitor treatment for MRSA infections. Performed at [facility], [place] [address], [place], [place] [address]     Radiology Reports DG Chest Port 1 View  Result Date: 11/13/2019 CLINICAL DATA:  Pt c/o worsening SOB. Pt diagnosed with COVID 12/31. Hx former smoker cough, sob, covid + EXAM: PORTABLE CHEST 1 VIEW COMPARISON:  01/17/2008 FINDINGS: Normal cardiac silhouette. There is patchy bilateral peripheral nodular airspace densities. No pleural fluid. No pneumothorax. No acute osseous abnormality. IMPRESSION: Patchy peripheral nodular airspace disease consistent with COVID viral pneumonia. Electronically Signed   By: Suzy Bouchard M.D.   On: 11/13/2019 12:58   ECHOCARDIOGRAM COMPLETE  Result Date: 11/14/2019   ECHOCARDIOGRAM REPORT   Patient Name:   Sylvia Turner Date of Exam: 11/14/2019 Medical Rec #:  [id number]      Height:       62.0 in Accession #:    [id number]     Weight:       147.0 lb Date of Birth:  1936/06/30      BSA:          1.68 m Patient Age:    5 years       BP:           149/77 mmHg Patient Gender: F              HR:           80 bpm. Exam Location:  Inpatient Procedure: 2D Echo, Cardiac Doppler and Color Doppler Indications:    Dyspnea  History:        Patient has no prior history of Echocardiogram examinations.                 COPD, Signs/Symptoms:Shortness of Breath; Risk Factors:Former                 Smoker. Covid positive.  Sonographer:    Dustin Flock Referring Phys: [place]  1. Left ventricular ejection fraction, by visual estimation, is 65 to 70%. The left ventricle has hyperdynamic function. There is no left ventricular hypertrophy.  2. Left ventricular diastolic parameters are consistent with Grade I diastolic  dysfunction (impaired relaxation).  3. The left ventricle has no regional wall motion abnormalities.  4. Global right ventricle has normal  systolic function.The right ventricular size is normal. No increase in right ventricular wall thickness.  5. Left atrial size was normal.  6. Right atrial size was normal.  7. The mitral valve is normal in structure. No evidence of mitral valve regurgitation.  8. The tricuspid valve is normal in structure.  9. The aortic valve is normal in structure. Aortic valve regurgitation is not visualized. 10. The pulmonic valve was not well visualized. Pulmonic valve regurgitation is not visualized. 11. Moderately elevated pulmonary artery systolic pressure. FINDINGS  Left Ventricle: Left ventricular ejection fraction, by visual estimation, is 65 to 70%. The left ventricle has hyperdynamic function. The left ventricle has no regional wall motion abnormalities. The left ventricular internal cavity size was the left ventricle is normal in size. There is no left ventricular hypertrophy. Left ventricular diastolic parameters are consistent with Grade I diastolic dysfunction (impaired relaxation). Normal left atrial pressure. Right Ventricle: The right ventricular size is normal. No increase in right ventricular wall thickness. Global RV systolic function is has normal systolic function. The tricuspid regurgitant velocity is 3.33 m/s, and with an assumed right atrial pressure  of 3 mmHg, the estimated right ventricular systolic pressure is moderately elevated at 47.4 mmHg. Left Atrium: Left atrial size was normal in size. Right Atrium: Right atrial size was normal in size Pericardium: There is no evidence of pericardial effusion. Mitral Valve: The mitral valve is normal in structure. No evidence of mitral valve regurgitation. Tricuspid Valve: The tricuspid valve is normal in structure. Tricuspid valve regurgitation mild-moderate. Aortic Valve: The aortic valve is normal in structure. Aortic valve regurgitation is not visualized. Pulmonic Valve: The pulmonic valve was not well visualized. Pulmonic valve regurgitation is not  visualized. Pulmonic regurgitation is not visualized. Aorta: The aortic root and ascending aorta are structurally normal, with no evidence of dilitation. IAS/Shunts: No atrial level shunt detected by color flow Doppler.  LEFT VENTRICLE PLAX 2D LVIDd:         3.06 cm  Diastology LVIDs:         2.16 cm  LV e' lateral:   7.18 cm/s LV PW:         1.09 cm  LV E/e' lateral: 8.4 LV IVS:        1.12 cm  LV e' medial:    7.29 cm/s LVOT diam:     2.00 cm  LV E/e' medial:  8.2 LV SV:         21 ml LV SV Index:   12.32 LVOT Area:     3.14 cm  RIGHT VENTRICLE RV S prime:     20.64 cm/s TAPSE (M-mode): 2.6 cm LEFT ATRIUM             Index       RIGHT ATRIUM           Index LA diam:        2.70 cm 1.61 cm/m  RA Area:     19.40 cm LA Vol (A2C):   34.9 ml 20.81 ml/m RA Volume:   59.10 ml  35.23 ml/m LA Vol (A4C):   37.4 ml 22.30 ml/m LA Biplane Vol: 38.6 ml 23.01 ml/m  AORTIC VALVE LVOT Vmax:   102.00 cm/s LVOT Vmean:  75.000 cm/s LVOT VTI:    0.206 m  AORTA Ao Root diam: 2.90 cm MITRAL VALVE  TRICUSPID VALVE MV Area (PHT): 5.38 cm             TR Peak grad:   44.4 mmHg MV PHT:        40.89 msec           TR Vmax:        333.00 cm/s MV Decel Time: 141 msec MV E velocity: 60.10 cm/s 103 cm/s  SHUNTS MV A velocity: 68.40 cm/s 70.3 cm/s Systemic VTI:  0.21 m MV E/A ratio:  0.88       1.5       Systemic Diam: 2.00 cm  Dani Gobble Croitoru MD Electronically signed by Sanda Klein MD Signature Date/Time: 11/14/2019/1:29:02 PM    Final

## 2019-11-17 NOTE — Progress Notes (Signed)
Ambulated with patient in hallway on 4L [place] for a distance of approximately 100' with the assistance of a front wheel walker.  Tolerated well.  Titrated to 3L [place] once returned to chair in patient's room.  Will continue to titrate O2 as able.  Physician informed.

## 2019-11-18 LAB — COMPREHENSIVE METABOLIC PANEL
ALT: 31 U/L (ref 0–44)
AST: 27 U/L (ref 15–41)
Albumin: 2.7 g/dL — ABNORMAL LOW (ref 3.5–5.0)
Alkaline Phosphatase: 85 U/L (ref 38–126)
Anion gap: 12 (ref 5–15)
BUN: 25 mg/dL — ABNORMAL HIGH (ref 8–23)
CO2: 27 mmol/L (ref 22–32)
Calcium: 8.1 mg/dL — ABNORMAL LOW (ref 8.9–10.3)
Chloride: 99 mmol/L (ref 98–111)
Creatinine, Ser: 0.58 mg/dL (ref 0.44–1.00)
GFR calc Af Amer: >60 mL/min (ref >60)
GFR calc non Af Amer: >60 mL/min (ref >60)
Glucose, Bld: 107 mg/dL — ABNORMAL HIGH (ref 70–99)
Potassium: 3.7 mmol/L (ref 3.5–5.1)
Sodium: 138 mmol/L (ref 135–145)
Total Bilirubin: 0.5 mg/dL (ref 0.3–1.2)
Total Protein: 5.5 g/dL — ABNORMAL LOW (ref 6.5–8.1)

## 2019-11-18 LAB — CBC WITH DIFFERENTIAL/PLATELET
Abs Immature Granulocytes: 0.72 10*3/uL — ABNORMAL HIGH (ref 0.00–0.07)
Basophils Absolute: 0.1 10*3/uL (ref 0.0–0.1)
Basophils Relative: 1 %
Eosinophils Absolute: 0 10*3/uL (ref 0.0–0.5)
Eosinophils Relative: 0 %
HCT: 42 % (ref 36.0–46.0)
Hemoglobin: 14.1 g/dL (ref 12.0–15.0)
Immature Granulocytes: 5 %
Lymphocytes Relative: 11 %
Lymphs Abs: 1.6 10*3/uL (ref 0.7–4.0)
MCH: 29.2 pg (ref 26.0–34.0)
MCHC: 33.6 g/dL (ref 30.0–36.0)
MCV: 87 fL (ref 80.0–100.0)
Monocytes Absolute: 0.9 10*3/uL (ref 0.1–1.0)
Monocytes Relative: 6 %
Neutro Abs: 10.9 10*3/uL — ABNORMAL HIGH (ref 1.7–7.7)
Neutrophils Relative %: 77 %
Platelets: 471 10*3/uL — ABNORMAL HIGH (ref 150–400)
RBC: 4.83 MIL/uL (ref 3.87–5.11)
RDW: 14.1 % (ref 11.5–15.5)
WBC: 14.3 10*3/uL — ABNORMAL HIGH (ref 4.0–10.5)
nRBC: 0 % (ref 0.0–0.2)

## 2019-11-18 LAB — CULTURE, BLOOD (ROUTINE X 2)
Culture: NO GROWTH
Culture: NO GROWTH
Special Requests: ADEQUATE

## 2019-11-18 LAB — BRAIN NATRIURETIC PEPTIDE: B Natriuretic Peptide: 52 pg/mL (ref 0.0–100.0)

## 2019-11-18 LAB — GLUCOSE, CAPILLARY
Glucose-Capillary: 114 mg/dL — ABNORMAL HIGH (ref 70–99)
Glucose-Capillary: 143 mg/dL — ABNORMAL HIGH (ref 70–99)
Glucose-Capillary: 178 mg/dL — ABNORMAL HIGH (ref 70–99)
Glucose-Capillary: 80 mg/dL (ref 70–99)

## 2019-11-18 LAB — D-DIMER, QUANTITATIVE: D-Dimer, Quant: 3.39 ug/mL-FEU — ABNORMAL HIGH (ref 0.00–0.50)

## 2019-11-18 LAB — MAGNESIUM: Magnesium: 2.2 mg/dL (ref 1.7–2.4)

## 2019-11-18 LAB — C-REACTIVE PROTEIN: CRP: 1.2 mg/dL — ABNORMAL HIGH (ref <1.0)

## 2019-11-18 MED ORDER — ACYCLOVIR 5 % EX OINT
TOPICAL_OINTMENT | CUTANEOUS | Status: DC
  Filled 2019-11-18 (×2): qty 15

## 2019-11-18 MED ORDER — ASPIRIN 81 MG PO CHEW: 81.0000 mg | CHEWABLE_TABLET | Freq: Every day | ORAL | 0 refills | Status: DC

## 2019-11-18 MED ORDER — VALACYCLOVIR HCL 1 G PO TABS: 1000.0000 mg | ORAL_TABLET | Freq: Every day | ORAL | 0 refills | Status: DC

## 2019-11-18 MED ORDER — ACYCLOVIR 5 % EX CREA
TOPICAL_CREAM | CUTANEOUS | Status: DC
  Filled 2019-11-18: qty 5

## 2019-11-18 MED ORDER — PANTOPRAZOLE SODIUM 40 MG PO TBEC: 40.0000 mg | DELAYED_RELEASE_TABLET | Freq: Every day | ORAL | 0 refills | Status: DC

## 2019-11-18 MED ORDER — VALACYCLOVIR HCL 500 MG PO TABS
1000.0000 mg | ORAL_TABLET | Freq: Every day | ORAL | Status: DC
  Administered 2019-11-18: 1000 mg via ORAL
  Filled 2019-11-18: qty 2

## 2019-11-18 MED ORDER — FUROSEMIDE 20 MG PO TABS: ORAL_TABLET | ORAL | 0 refills | Status: DC

## 2019-11-18 MED ORDER — DEXAMETHASONE 6 MG PO TABS: 6.0000 mg | ORAL_TABLET | Freq: Every day | ORAL | 0 refills | Status: DC

## 2019-11-18 NOTE — TOC Transition Note (Signed)
Transition of Care [facility]) - CM/SW Discharge Note   Patient Details  Name: Sylvia Turner MRN: [id number] Date of Birth: [birth year]  [facility] CM/SW Contact:  Atilano Median, LCSW Phone Number: 11/18/2019, 4:37 PM   Clinical Narrative:    Discharged home. Oxygen concentrator was delivered to the home prior to discharge. Portable tank was provided to room prior to discharge.   CSW asked AC to deliver rolling walker and 3in1 to room prior to discharge. CSW received call from patient's daughter Manuela Schwartz stating that they did not get the DME.   CSW spoke with Magda Paganini and DME will be delivered to patient's home on Monday 1/18. CSW informed patient's daughter.  NO other needs at this time. Case closed to this CSW.    Final next level of care: Home w Home Health Services Barriers to Discharge: Barriers Resolved   Patient Goals and CMS Choice   CMS Medicare.gov Compare Post Acute Care list provided to:: Patient Represenative (must comment)(Susan) Choice offered to / list presented to : Adult Children  Discharge Placement                       Discharge Plan and Services In-house Referral: Clinical Social Work   Post Acute Care Choice: Home Health          DME Arranged: Walker rolling, 3-N-1, Oxygen DME Agency: [facility] Date DME Agency Contacted: 11/18/19 Time DME Agency Contacted: 1100 Representative spoke with at DME Agency: Magda Paganini; Caryl Pina HH Arranged: PT, OT, RN [facility] Agency: [facility] Date [place]: 11/17/19   Representative spoke with at [place]: [place] ([place]) Interventions     Readmission Risk Interventions Readmission Risk Prevention Plan 11/17/2019  Medication Screening Complete  Transportation Screening Complete  Some recent data might be hidden

## 2019-11-18 NOTE — Progress Notes (Signed)
Ambulated with patient in hallway for a distance of approximately 200' on 3L [place] with the assistance of a front wheel walker.  Tolerated well.  Education with teach back provided to patient on home oxygen use.

## 2019-11-18 NOTE — Discharge Instructions (Signed)
Person Under Monitoring Name: Sylvia Turner  Location: [place] [id number]   Infection Prevention Recommendations for Individuals Confirmed to have, or Being Evaluated for, 2019 Novel Coronavirus (COVID-19) Infection Who Receive Care at Home  Individuals who are confirmed to have, or are being evaluated for, COVID-19 should follow the prevention steps below until a healthcare provider or local or state health department says they can return to normal activities.  Stay home except to get medical care You should restrict activities outside your home, except for getting medical care. Do not go to work, school, or public areas, and do not use public transportation or taxis.  Call ahead before visiting your doctor Before your medical appointment, call the healthcare provider and tell them that you have, or are being evaluated for, COVID-19 infection. This will help the healthcare providers office take steps to keep other people from getting infected. Ask your healthcare provider to call the local or state health department.  Monitor your symptoms Seek prompt medical attention if your illness is worsening (e.g., difficulty breathing). Before going to your medical appointment, call the healthcare provider and tell them that you have, or are being evaluated for, COVID-19 infection. Ask your healthcare provider to call the local or state health department.  Wear a facemask You should wear a facemask that covers your nose and mouth when you are in the same room with other people and when you visit a healthcare provider. People who live with or visit you should also wear a facemask while they are in the same room with you.  Separate yourself from other people in your home As much as possible, you should stay in a different room from other people in your home. Also, you should use a separate bathroom, if available.  Avoid sharing household items You should not share  dishes, drinking glasses, cups, eating utensils, towels, bedding, or other items with other people in your home. After using these items, you should wash them thoroughly with soap and water.  Cover your coughs and sneezes Cover your mouth and nose with a tissue when you cough or sneeze, or you can cough or sneeze into your sleeve. Throw used tissues in a lined trash can, and immediately wash your hands with soap and water for at least 20 seconds or use an alcohol-based hand rub.  Wash your [facility] your hands often and thoroughly with soap and water for at least 20 seconds. You can use an alcohol-based hand sanitizer if soap and water are not available and if your hands are not visibly dirty. Avoid touching your eyes, nose, and mouth with unwashed hands.   Prevention Steps for Caregivers and Household Members of Individuals Confirmed to have, or Being Evaluated for, COVID-19 Infection Being Cared for in the Home  If you live with, or provide care at home for, a person confirmed to have, or being evaluated for, COVID-19 infection please follow these guidelines to prevent infection:  Follow healthcare providers instructions Make sure that you understand and can help the patient follow any healthcare provider instructions for all care.  Provide for the patients basic needs You should help the patient with basic needs in the home and provide support for getting groceries, prescriptions, and other personal needs.  Monitor the patients symptoms If they are getting sicker, call his or her medical provider and tell them that the patient has, or is being evaluated for, COVID-19 infection. This will help the healthcare providers office  take steps to keep other people from getting infected. Ask the healthcare provider to call the local or state health department.  Limit the number of people who have contact with the patient  If possible, have only one caregiver for the patient.  Other  household members should stay in another home or place of residence. If this is not possible, they should stay  in another room, or be separated from the patient as much as possible. Use a separate bathroom, if available.  Restrict visitors who do not have an essential need to be in the home.  Keep older adults, very young children, and other sick people away from the patient Keep older adults, very young children, and those who have compromised immune systems or chronic health conditions away from the patient. This includes people with chronic heart, lung, or kidney conditions, diabetes, and cancer.  Ensure good ventilation Make sure that shared spaces in the home have good air flow, such as from an air conditioner or an opened window, weather permitting.  Wash your hands often  Wash your hands often and thoroughly with soap and water for at least 20 seconds. You can use an alcohol based hand sanitizer if soap and water are not available and if your hands are not visibly dirty.  Avoid touching your eyes, nose, and mouth with unwashed hands.  Use disposable paper towels to dry your hands. If not available, use dedicated cloth towels and replace them when they become wet.  Wear a facemask and gloves  Wear a disposable facemask at all times in the room and gloves when you touch or have contact with the patients blood, body fluids, and/or secretions or excretions, such as sweat, saliva, sputum, nasal mucus, vomit, urine, or feces.  Ensure the mask fits over your nose and mouth tightly, and do not touch it during use.  Throw out disposable facemasks and gloves after using them. Do not reuse.  Wash your hands immediately after removing your facemask and gloves.  If your personal clothing becomes contaminated, carefully remove clothing and launder. Wash your hands after handling contaminated clothing.  Place all used disposable facemasks, gloves, and other waste in a lined container before  disposing them with other household waste.  Remove gloves and wash your hands immediately after handling these items.  Do not share dishes, glasses, or other household items with the patient  Avoid sharing household items. You should not share dishes, drinking glasses, cups, eating utensils, towels, bedding, or other items with a patient who is confirmed to have, or being evaluated for, COVID-19 infection.  After the person uses these items, you should wash them thoroughly with soap and water.  Wash laundry thoroughly  Immediately remove and wash clothes or bedding that have blood, body fluids, and/or secretions or excretions, such as sweat, saliva, sputum, nasal mucus, vomit, urine, or feces, on them.  Wear gloves when handling laundry from the patient.  Read and follow directions on labels of laundry or clothing items and detergent. In general, wash and dry with the warmest temperatures recommended on the label.  Clean all areas the individual has used often  Clean all touchable surfaces, such as counters, tabletops, doorknobs, bathroom fixtures, toilets, phones, keyboards, tablets, and bedside tables, every day. Also, clean any surfaces that may have blood, body fluids, and/or secretions or excretions on them.  Wear gloves when cleaning surfaces the patient has come in contact with.  Use a diluted bleach solution (e.g., dilute bleach with 1 part  bleach and 10 parts water) or a household disinfectant with a label that says EPA-registered for coronaviruses. To make a bleach solution at home, add 1 tablespoon of bleach to 1 quart (4 cups) of water. For a larger supply, add  cup of bleach to 1 gallon (16 cups) of water.  Read labels of cleaning products and follow recommendations provided on product labels. Labels contain instructions for safe and effective use of the cleaning product including precautions you should take when applying the product, such as wearing gloves or eye protection  and making sure you have good ventilation during use of the product.  Remove gloves and wash hands immediately after cleaning.  Monitor yourself for signs and symptoms of illness Caregivers and household members are considered close contacts, should monitor their health, and will be asked to limit movement outside of the home to the extent possible. Follow the monitoring steps for close contacts listed on the symptom monitoring form.   ? If you have additional questions, contact your local health department or call the epidemiologist on call at [phone] (available 24/7). ? This guidance is subject to change. For the most up-to-date guidance from [facility], please refer to their website: [email or web address]

## 2019-11-18 NOTE — Discharge Summary (Signed)
Sylvia Turner, is a 84 y.o. female  DOB Apr 17, 1936  MRN [id number].  Admission date:  11/13/2019  Admitting Physician  No admitting provider for patient encounter.  Discharge Date:  11/18/2019   Primary MD  Asencion Noble, MD  Recommendations for primary care physician for things to follow:  -Please check CBC, CMP during next visit   Admission Diagnosis  Acute respiratory failure with hypoxia ([place]) [J96.01] COVID-19 virus infection [U07.1] Acute respiratory disease due to COVID-19 virus [U07.1, J06.9]   Discharge Diagnosis  Acute respiratory failure with hypoxia ([place]) [J96.01] COVID-19 virus infection [U07.1] Acute respiratory disease due to COVID-19 virus [U07.1, J06.9]    Active Problems:   Acute respiratory disease due to COVID-19 virus      Past Medical History:  Diagnosis Date  . Deaf L Ear  . Hip problem   . Post herpetic neuralgia     Past Surgical History:  Procedure Laterality Date  . CHOLECYSTECTOMY    . EXTERNAL EAR SURGERY         History of present illness and  Hospital Course:     Kindly see H&P for history of present illness and admission details, please review complete Labs, Consult reports and Test reports for all details in brief  HPI  from the history and physical done on the day of admission 11/13/2019  HPI: Sylvia Turner is a 84 y.o. female with past medical history significant for postherpetic neuralgia, COPD (no requiring oxygen and only using as needed albuterol), prior history of tobacco use, hypothyroidism, restless leg syndrome and insomnia; who was diagnosed with coronavirus infection on 11/02/19 I was managing her symptoms at home.  Given recent decompensation based on her age, history of COPD patient was supposed to receive monoclonal antibody as an outpatient, but that never happens.  Patient symptoms continued progressing and worsening and she expressed  having significant shortness of breath, dyspnea on exertion, decreased appetite, general malaise.  Patient also reported chills but no frank fevers.  Patient denies chest pain, dysuria, hematuria, nausea, vomiting, abdominal pain, melena, hematochezia, headaches, blurred vision or focal weakness.  In the ED chest x-ray demonstrated patchy peripheral nodular airspace disease consistent with Covid viral pneumonia; patient was found with a oxygen saturation of 84% on room air and required up to 6-7 L initially to maintain O2 sats above 90%.  With physical findings suggesting dehydration (dry lips and decreased skin turgor), respiratory rate in the mid 30s, mild tachycardia, elevated WBCs in the 19,000 range, potassium of 5.4, CRP 28, elevated troponin (228), normal lactic acid, procalcitonin 0.25, LDH 567, ferritin 413 and D-dimer 15.26.  LFTs and patient's creatinine within normal limits.  TRH has been called admit patient for further evaluation and management.  IV fluids initiated in the ED, patient started on steroids and remdesivir.  Hospital Course     Acute Hypoxic Resp. Failure due to Acute Covid 19 Viral Pneumonitis during the ongoing 2020 Covid 19 Pandemic  -She had severe disease, requiring 15 L high flow nasal  cannula initially, slowly improving, able to wean to 2 L at rest, 3 L on exertion . -Treated with IV remdesivir, IV steroids, to finish another 5 days of oral Decadron as an outpatient . -Encouraged to take her incentive spirometry and flutter valve and keep using it at home . -Received Actemra 1/11. - inflammatory markers trending down which is reassuring.  Her D-dimer is elevated at 3.39, so she was encouraged to take baby aspirin for total of 2 weeks.   Acute diastolic CHF -2D echo with grade 1 diastolic dysfunction, volume overload on physical exam, was treated with IV Lasix during hospital stay, volume status significantly improved, she remains mildly volume overloaded, so will  discharge on low-dose Lasix .  elevated troponin -Non-ACS pattern, in the setting of demand ischemia, 2D echo with no regional wall motion abnormality. - No chest pain, EKG nonacute, echo pending.  Symptom-free will monitor.  Started  on baby aspirin.  Hypothyroidism.   - Continue home dose Synthroid,  Herpes labialis -Morning she had a small herpetic lesion on her lower lip, will discharge on Valtrex.  History of postherpetic neuralgia and restless leg syndrome.  On combination of Neurontin and Mirapex.                 Hyperglycemia, this is steroid-induced.    A1c is 6.1   Discharge Condition:  stable   Follow UP  Follow-up Information    [facility] Follow up.   Specialty: Home Health Services Why: [facility] staff will call you to schedule home visits Contact information: [address] [place] [address] [phone]        [facility] Follow up.   Why: Your DME(rolling walker and 3in1) is from this company Contact information: [place] [address] [phone]        [facility] Follow up.   Why: Your oxygen is from this company Contact information: [place] [address] [phone]             Discharge Instructions  and  Discharge Medications    Discharge Instructions    Discharge instructions   Complete by: As directed    Follow with Primary MD Asencion Noble, MD in 14 days   Get CBC, CMP,  checked  by Primary MD next visit.    Activity: As tolerated with Full fall precautions use walker/cane & assistance as needed   Disposition Home    Diet: Heart Healthy ** , with feeding assistance and aspiration precautions.  For Heart failure patients - Check your Weight same time everyday, if you gain over 2 pounds, or you develop in leg swelling, experience more shortness of breath or chest pain, call your Primary MD immediately. Follow Cardiac Low Salt Diet  and 1.5 lit/day fluid restriction.   On your next visit with your primary care physician please Get Medicines reviewed and adjusted.   Please request your Prim.MD to go over all Hospital Tests and Procedure/Radiological results at the follow up, please get all Hospital records sent to your Prim MD by signing hospital release before you go home.   If you experience worsening of your admission symptoms, develop shortness of breath, life threatening emergency, suicidal or homicidal thoughts you must seek medical attention immediately by calling 911 or calling your MD immediately  if symptoms less severe.  You Must read complete instructions/literature along with all the possible adverse reactions/side effects for all  the Medicines you take and that have been prescribed to you. Take any new Medicines after you have completely understood and accpet all the possible adverse reactions/side effects.   Do not drive, operating heavy machinery, perform activities at heights, swimming or participation in water activities or provide baby sitting services if your were admitted for syncope or siezures until you have seen by Primary MD or a Neurologist and advised to do so again.  Do not drive when taking Pain medications.    Do not take more than prescribed Pain, Sleep and Anxiety Medications  Special Instructions: If you have smoked or chewed Tobacco  in the last 2 yrs please stop smoking, stop any regular Alcohol  and or any Recreational drug use.  Wear Seat belts while driving.   Please note  You were cared for by a hospitalist during your hospital stay. If you have any questions about your discharge medications or the care you received while you were in the hospital after you are discharged, you can call the unit and asked to speak with the hospitalist on call if the hospitalist that took care of you is not available. Once you are discharged, your primary care physician will handle any further  medical issues. Please note that NO REFILLS for any discharge medications will be authorized once you are discharged, as it is imperative that you return to your primary care physician (or establish a relationship with a primary care physician if you do not have one) for your aftercare needs so that they can reassess your need for medications and monitor your lab values.   Increase activity slowly   Complete by: As directed      Allergies as of 11/18/2019      Reactions   Hydrocodone Nausea And Vomiting   Other    Ex. Perfume smells, candle shop      Medication List    TAKE these medications   aspirin 81 MG chewable tablet Chew 1 tablet (81 mg total) by mouth daily. Start taking on: November 19, 2019   dexamethasone 6 MG tablet Commonly known as: DECADRON Take 1 tablet (6 mg total) by mouth daily. Start taking on: November 19, 2019   furosemide 20 MG tablet Commonly known as: Lasix Please take 40 mg oral daily x7 days,then  20 mg oral daily  x7 days   gabapentin 300 MG capsule Commonly known as: NEURONTIN Take 300 mg by mouth at bedtime.   levothyroxine 100 MCG tablet Commonly known as: SYNTHROID Take 100 mcg by mouth daily before breakfast.   MAGNESIUM PO Take 1 tablet by mouth daily.   pantoprazole 40 MG tablet Commonly known as: Protonix Take 1 tablet (40 mg total) by mouth daily.   pramipexole 0.25 MG tablet Commonly known as: MIRAPEX Take 2 tablets by mouth every evening.   traZODone 50 MG tablet Commonly known as: DESYREL Take 1 tablet by mouth at bedtime.   TURMERIC PO Take 1 tablet by mouth daily.   valACYclovir 1000 MG tablet Commonly known as: VALTREX Take 1 tablet (1,000 mg total) by mouth daily.   VITAMIN B-12 PO Take 1 tablet by mouth daily.            Durable Medical Equipment  (From admission, onward)         Start     Ordered   11/17/19 1647  DME Oxygen  (Discharge Planning)  Once    Question Answer Comment  Length of Need 6  Months  Mode or (Route) Nasal cannula   Liters per Minute 2   Frequency Continuous (stationary and portable oxygen unit needed)   Oxygen conserving device Yes   Oxygen delivery system Gas      11/17/19 1646            Diet and Activity recommendation: See Discharge Instructions above   Consults obtained -  None   Major procedures and Radiology Reports - PLEASE review detailed and final reports for all details, in brief -     DG Chest Port 1 View  Result Date: 11/13/2019 CLINICAL DATA:  Pt c/o worsening SOB. Pt diagnosed with COVID 12/31. Hx former smoker cough, sob, covid + EXAM: PORTABLE CHEST 1 VIEW COMPARISON:  01/17/2008 FINDINGS: Normal cardiac silhouette. There is patchy bilateral peripheral nodular airspace densities. No pleural fluid. No pneumothorax. No acute osseous abnormality. IMPRESSION: Patchy peripheral nodular airspace disease consistent with COVID viral pneumonia. Electronically Signed   By: Suzy Bouchard M.D.   On: 11/13/2019 12:58   ECHOCARDIOGRAM COMPLETE  Result Date: 11/14/2019   ECHOCARDIOGRAM REPORT   Patient Name:   MICAYA ARCEO Date of Exam: 11/14/2019 Medical Rec #:  [id number]      Height:       62.0 in Accession #:    [id number]     Weight:       147.0 lb Date of Birth:  December 29, 1935      BSA:          1.68 m Patient Age:    34 years       BP:           149/77 mmHg Patient Gender: F              HR:           80 bpm. Exam Location:  Inpatient Procedure: 2D Echo, Cardiac Doppler and Color Doppler Indications:    Dyspnea  History:        Patient has no prior history of Echocardiogram examinations.                 COPD, Signs/Symptoms:Shortness of Breath; Risk Factors:Former                 Smoker. Covid positive.  Sonographer:    Dustin Flock Referring Phys: [place]  1. Left ventricular ejection fraction, by visual estimation, is 65 to 70%. The left ventricle has hyperdynamic function. There is no left ventricular hypertrophy.   2. Left ventricular diastolic parameters are consistent with Grade I diastolic dysfunction (impaired relaxation).  3. The left ventricle has no regional wall motion abnormalities.  4. Global right ventricle has normal systolic function.The right ventricular size is normal. No increase in right ventricular wall thickness.  5. Left atrial size was normal.  6. Right atrial size was normal.  7. The mitral valve is normal in structure. No evidence of mitral valve regurgitation.  8. The tricuspid valve is normal in structure.  9. The aortic valve is normal in structure. Aortic valve regurgitation is not visualized. 10. The pulmonic valve was not well visualized. Pulmonic valve regurgitation is not visualized. 11. Moderately elevated pulmonary artery systolic pressure. FINDINGS  Left Ventricle: Left ventricular ejection fraction, by visual estimation, is 65 to 70%. The left ventricle has hyperdynamic function. The left ventricle has no regional wall motion abnormalities. The left ventricular internal cavity size was the left ventricle is normal in size. There is no left ventricular hypertrophy. Left ventricular diastolic  parameters are consistent with Grade I diastolic dysfunction (impaired relaxation). Normal left atrial pressure. Right Ventricle: The right ventricular size is normal. No increase in right ventricular wall thickness. Global RV systolic function is has normal systolic function. The tricuspid regurgitant velocity is 3.33 m/s, and with an assumed right atrial pressure  of 3 mmHg, the estimated right ventricular systolic pressure is moderately elevated at 47.4 mmHg. Left Atrium: Left atrial size was normal in size. Right Atrium: Right atrial size was normal in size Pericardium: There is no evidence of pericardial effusion. Mitral Valve: The mitral valve is normal in structure. No evidence of mitral valve regurgitation. Tricuspid Valve: The tricuspid valve is normal in structure. Tricuspid valve regurgitation  mild-moderate. Aortic Valve: The aortic valve is normal in structure. Aortic valve regurgitation is not visualized. Pulmonic Valve: The pulmonic valve was not well visualized. Pulmonic valve regurgitation is not visualized. Pulmonic regurgitation is not visualized. Aorta: The aortic root and ascending aorta are structurally normal, with no evidence of dilitation. IAS/Shunts: No atrial level shunt detected by color flow Doppler.  LEFT VENTRICLE PLAX 2D LVIDd:         3.06 cm  Diastology LVIDs:         2.16 cm  LV e' lateral:   7.18 cm/s LV PW:         1.09 cm  LV E/e' lateral: 8.4 LV IVS:        1.12 cm  LV e' medial:    7.29 cm/s LVOT diam:     2.00 cm  LV E/e' medial:  8.2 LV SV:         21 ml LV SV Index:   12.32 LVOT Area:     3.14 cm  RIGHT VENTRICLE RV S prime:     20.64 cm/s TAPSE (M-mode): 2.6 cm LEFT ATRIUM             Index       RIGHT ATRIUM           Index LA diam:        2.70 cm 1.61 cm/m  RA Area:     19.40 cm LA Vol (A2C):   34.9 ml 20.81 ml/m RA Volume:   59.10 ml  35.23 ml/m LA Vol (A4C):   37.4 ml 22.30 ml/m LA Biplane Vol: 38.6 ml 23.01 ml/m  AORTIC VALVE LVOT Vmax:   102.00 cm/s LVOT Vmean:  75.000 cm/s LVOT VTI:    0.206 m  AORTA Ao Root diam: 2.90 cm MITRAL VALVE                        TRICUSPID VALVE MV Area (PHT): 5.38 cm             TR Peak grad:   44.4 mmHg MV PHT:        40.89 msec           TR Vmax:        333.00 cm/s MV Decel Time: 141 msec MV E velocity: 60.10 cm/s 103 cm/s  SHUNTS MV A velocity: 68.40 cm/s 70.3 cm/s Systemic VTI:  0.21 m MV E/A ratio:  0.88       1.5       Systemic Diam: 2.00 cm  Dani Gobble Croitoru MD Electronically signed by Sanda Klein MD Signature Date/Time: 11/14/2019/1:29:02 PM    Final     Micro Results    Recent Results (from the past 240 hour(s))  Blood Culture (routine  x 2)     Status: None   Collection Time: 11/13/19 12:36 PM   Specimen: BLOOD RIGHT FOREARM  Result Value Ref Range Status   Specimen Description BLOOD RIGHT FOREARM  Final    Special Requests   Final    BOTTLES DRAWN AEROBIC AND ANAEROBIC Blood Culture results may not be optimal due to an inadequate volume of blood received in culture bottles   Culture   Final    NO GROWTH 5 DAYS Performed at [facility], [address], [place], [place] [address]    Report Status 11/18/2019 FINAL  Final  Blood Culture (routine x 2)     Status: None   Collection Time: 11/13/19 12:41 PM   Specimen: Right Antecubital; Blood  Result Value Ref Range Status   Specimen Description RIGHT ANTECUBITAL  Final   Special Requests   Final    BOTTLES DRAWN AEROBIC AND ANAEROBIC Blood Culture adequate volume   Culture   Final    NO GROWTH 5 DAYS Performed at [facility], [address] [place], [place] [address]    Report Status 11/18/2019 FINAL  Final  MRSA PCR Screening     Status: None   Collection Time: 11/16/19  5:58 PM   Specimen: Nasal Mucosa; Nasopharyngeal  Result Value Ref Range Status   MRSA by PCR NEGATIVE NEGATIVE Final    Comment:        The GeneXpert MRSA Assay (FDA approved for NASAL specimens only), is one component of a comprehensive MRSA colonization surveillance program. It is not intended to diagnose MRSA infection nor to guide or monitor treatment for MRSA infections. Performed at [facility], [place] [address] [place], [place] [address]        Today   Subjective:   Theodoro Grist today has no headache,no chest abdominal pain,no new weakness tingling or numbness, feels much better wants to go home today.  reports dyspnea significantly improved, ambulated yesterday in the hallway with minimal dyspnea  Objective:   Blood pressure 104/82, pulse 99, temperature 97.7 F (36.5 C), temperature source Oral, resp. rate 18, height 5\' 2"  (1.575 m), weight 64.6 kg, SpO2 92 %.   Intake/Output Summary (Last 24 hours) at 11/18/2019 1338 Last data filed at 11/17/2019 1600 Gross per 24 hour  Intake --  Output 500 ml  Net -500 ml     Exam Awake Alert, Oriented x 3, No new F.N deficits, Normal affect Symmetrical Chest wall movement, Good air movement bilaterally, CTAB RRR,No Gallops,Rubs or new Murmurs, No Parasternal Heave +ve B.Sounds, Abd Soft, Non tender,  No rebound -guarding or rigidity. No Cyanosis, Clubbing or edema, No new Rash or bruise  Data Review   CBC w Diff:  Lab Results  Component Value Date   WBC 14.3 (H) 11/18/2019   HGB 14.1 11/18/2019   HCT 42.0 11/18/2019   PLT 471 (H) 11/18/2019   LYMPHOPCT 11 11/18/2019   BANDSPCT 4 11/13/2019   MONOPCT 6 11/18/2019   EOSPCT 0 11/18/2019   BASOPCT 1 11/18/2019    CMP:  Lab Results  Component Value Date   NA 138 11/18/2019   K 3.7 11/18/2019   CL 99 11/18/2019   CO2 27 11/18/2019   BUN 25 (H) 11/18/2019   CREATININE 0.58 11/18/2019   PROT 5.5 (L) 11/18/2019   ALBUMIN 2.7 (L) 11/18/2019   BILITOT 0.5 11/18/2019   ALKPHOS 85 11/18/2019   AST 27 11/18/2019   ALT 31 11/18/2019  .   Total Time  in preparing paper work, data evaluation and todays exam - 46 minutes  Phillips Climes M.D on 11/18/2019 at [facility]   Office  [phone]

## 2019-11-19 DIAGNOSIS — U071 COVID-19: Secondary | ICD-10-CM | POA: Diagnosis not present

## 2019-11-24 DIAGNOSIS — U071 COVID-19: Secondary | ICD-10-CM | POA: Diagnosis not present

## 2019-11-24 DIAGNOSIS — J449 Chronic obstructive pulmonary disease, unspecified: Secondary | ICD-10-CM | POA: Diagnosis not present

## 2019-11-24 DIAGNOSIS — I5031 Acute diastolic (congestive) heart failure: Secondary | ICD-10-CM | POA: Diagnosis not present

## 2019-11-24 DIAGNOSIS — G2581 Restless legs syndrome: Secondary | ICD-10-CM | POA: Diagnosis not present

## 2019-11-24 DIAGNOSIS — G14 Postpolio syndrome: Secondary | ICD-10-CM | POA: Diagnosis not present

## 2019-11-24 DIAGNOSIS — F419 Anxiety disorder, unspecified: Secondary | ICD-10-CM | POA: Diagnosis not present

## 2019-11-24 DIAGNOSIS — G4700 Insomnia, unspecified: Secondary | ICD-10-CM | POA: Diagnosis not present

## 2019-11-24 DIAGNOSIS — B0229 Other postherpetic nervous system involvement: Secondary | ICD-10-CM | POA: Diagnosis not present

## 2019-11-24 DIAGNOSIS — E039 Hypothyroidism, unspecified: Secondary | ICD-10-CM | POA: Diagnosis not present

## 2019-11-28 DIAGNOSIS — E039 Hypothyroidism, unspecified: Secondary | ICD-10-CM | POA: Diagnosis not present

## 2019-11-28 DIAGNOSIS — G2581 Restless legs syndrome: Secondary | ICD-10-CM | POA: Diagnosis not present

## 2019-11-28 DIAGNOSIS — I5031 Acute diastolic (congestive) heart failure: Secondary | ICD-10-CM | POA: Diagnosis not present

## 2019-11-28 DIAGNOSIS — J1282 Pneumonia due to coronavirus disease 2019: Secondary | ICD-10-CM | POA: Diagnosis not present

## 2019-11-28 DIAGNOSIS — G14 Postpolio syndrome: Secondary | ICD-10-CM | POA: Diagnosis not present

## 2019-11-28 DIAGNOSIS — E43 Unspecified severe protein-calorie malnutrition: Secondary | ICD-10-CM | POA: Diagnosis not present

## 2019-11-28 DIAGNOSIS — F419 Anxiety disorder, unspecified: Secondary | ICD-10-CM | POA: Diagnosis not present

## 2019-11-28 DIAGNOSIS — J9601 Acute respiratory failure with hypoxia: Secondary | ICD-10-CM | POA: Diagnosis not present

## 2019-11-28 DIAGNOSIS — J449 Chronic obstructive pulmonary disease, unspecified: Secondary | ICD-10-CM | POA: Diagnosis not present

## 2019-11-28 DIAGNOSIS — B0229 Other postherpetic nervous system involvement: Secondary | ICD-10-CM | POA: Diagnosis not present

## 2019-11-28 DIAGNOSIS — U071 COVID-19: Secondary | ICD-10-CM | POA: Diagnosis not present

## 2019-11-28 DIAGNOSIS — G4700 Insomnia, unspecified: Secondary | ICD-10-CM | POA: Diagnosis not present

## 2019-11-30 DIAGNOSIS — I5031 Acute diastolic (congestive) heart failure: Secondary | ICD-10-CM | POA: Diagnosis not present

## 2019-11-30 DIAGNOSIS — G14 Postpolio syndrome: Secondary | ICD-10-CM | POA: Diagnosis not present

## 2019-11-30 DIAGNOSIS — E039 Hypothyroidism, unspecified: Secondary | ICD-10-CM | POA: Diagnosis not present

## 2019-11-30 DIAGNOSIS — U071 COVID-19: Secondary | ICD-10-CM | POA: Diagnosis not present

## 2019-11-30 DIAGNOSIS — G4700 Insomnia, unspecified: Secondary | ICD-10-CM | POA: Diagnosis not present

## 2019-11-30 DIAGNOSIS — F419 Anxiety disorder, unspecified: Secondary | ICD-10-CM | POA: Diagnosis not present

## 2019-11-30 DIAGNOSIS — J449 Chronic obstructive pulmonary disease, unspecified: Secondary | ICD-10-CM | POA: Diagnosis not present

## 2019-11-30 DIAGNOSIS — G2581 Restless legs syndrome: Secondary | ICD-10-CM | POA: Diagnosis not present

## 2019-11-30 DIAGNOSIS — B0229 Other postherpetic nervous system involvement: Secondary | ICD-10-CM | POA: Diagnosis not present

## 2019-12-06 DIAGNOSIS — I5031 Acute diastolic (congestive) heart failure: Secondary | ICD-10-CM | POA: Diagnosis not present

## 2019-12-06 DIAGNOSIS — G4700 Insomnia, unspecified: Secondary | ICD-10-CM | POA: Diagnosis not present

## 2019-12-06 DIAGNOSIS — J449 Chronic obstructive pulmonary disease, unspecified: Secondary | ICD-10-CM | POA: Diagnosis not present

## 2019-12-06 DIAGNOSIS — U071 COVID-19: Secondary | ICD-10-CM | POA: Diagnosis not present

## 2019-12-06 DIAGNOSIS — G2581 Restless legs syndrome: Secondary | ICD-10-CM | POA: Diagnosis not present

## 2019-12-06 DIAGNOSIS — G14 Postpolio syndrome: Secondary | ICD-10-CM | POA: Diagnosis not present

## 2019-12-06 DIAGNOSIS — E039 Hypothyroidism, unspecified: Secondary | ICD-10-CM | POA: Diagnosis not present

## 2019-12-06 DIAGNOSIS — F419 Anxiety disorder, unspecified: Secondary | ICD-10-CM | POA: Diagnosis not present

## 2019-12-06 DIAGNOSIS — B0229 Other postherpetic nervous system involvement: Secondary | ICD-10-CM | POA: Diagnosis not present

## 2019-12-08 DIAGNOSIS — G2581 Restless legs syndrome: Secondary | ICD-10-CM | POA: Diagnosis not present

## 2019-12-08 DIAGNOSIS — G14 Postpolio syndrome: Secondary | ICD-10-CM | POA: Diagnosis not present

## 2019-12-08 DIAGNOSIS — F419 Anxiety disorder, unspecified: Secondary | ICD-10-CM | POA: Diagnosis not present

## 2019-12-08 DIAGNOSIS — J449 Chronic obstructive pulmonary disease, unspecified: Secondary | ICD-10-CM | POA: Diagnosis not present

## 2019-12-08 DIAGNOSIS — I5031 Acute diastolic (congestive) heart failure: Secondary | ICD-10-CM | POA: Diagnosis not present

## 2019-12-08 DIAGNOSIS — U071 COVID-19: Secondary | ICD-10-CM | POA: Diagnosis not present

## 2019-12-08 DIAGNOSIS — G4700 Insomnia, unspecified: Secondary | ICD-10-CM | POA: Diagnosis not present

## 2019-12-08 DIAGNOSIS — B0229 Other postherpetic nervous system involvement: Secondary | ICD-10-CM | POA: Diagnosis not present

## 2019-12-08 DIAGNOSIS — E039 Hypothyroidism, unspecified: Secondary | ICD-10-CM | POA: Diagnosis not present

## 2019-12-12 DIAGNOSIS — U071 COVID-19: Secondary | ICD-10-CM | POA: Diagnosis not present

## 2019-12-12 DIAGNOSIS — J449 Chronic obstructive pulmonary disease, unspecified: Secondary | ICD-10-CM | POA: Diagnosis not present

## 2019-12-12 DIAGNOSIS — F419 Anxiety disorder, unspecified: Secondary | ICD-10-CM | POA: Diagnosis not present

## 2019-12-12 DIAGNOSIS — G2581 Restless legs syndrome: Secondary | ICD-10-CM | POA: Diagnosis not present

## 2019-12-12 DIAGNOSIS — G14 Postpolio syndrome: Secondary | ICD-10-CM | POA: Diagnosis not present

## 2019-12-12 DIAGNOSIS — G4700 Insomnia, unspecified: Secondary | ICD-10-CM | POA: Diagnosis not present

## 2019-12-12 DIAGNOSIS — B0229 Other postherpetic nervous system involvement: Secondary | ICD-10-CM | POA: Diagnosis not present

## 2019-12-12 DIAGNOSIS — I5031 Acute diastolic (congestive) heart failure: Secondary | ICD-10-CM | POA: Diagnosis not present

## 2019-12-12 DIAGNOSIS — E039 Hypothyroidism, unspecified: Secondary | ICD-10-CM | POA: Diagnosis not present

## 2019-12-14 DIAGNOSIS — F419 Anxiety disorder, unspecified: Secondary | ICD-10-CM | POA: Diagnosis not present

## 2019-12-14 DIAGNOSIS — U071 COVID-19: Secondary | ICD-10-CM | POA: Diagnosis not present

## 2019-12-14 DIAGNOSIS — I5031 Acute diastolic (congestive) heart failure: Secondary | ICD-10-CM | POA: Diagnosis not present

## 2019-12-14 DIAGNOSIS — E039 Hypothyroidism, unspecified: Secondary | ICD-10-CM | POA: Diagnosis not present

## 2019-12-14 DIAGNOSIS — G4700 Insomnia, unspecified: Secondary | ICD-10-CM | POA: Diagnosis not present

## 2019-12-14 DIAGNOSIS — G14 Postpolio syndrome: Secondary | ICD-10-CM | POA: Diagnosis not present

## 2019-12-14 DIAGNOSIS — J449 Chronic obstructive pulmonary disease, unspecified: Secondary | ICD-10-CM | POA: Diagnosis not present

## 2019-12-14 DIAGNOSIS — B0229 Other postherpetic nervous system involvement: Secondary | ICD-10-CM | POA: Diagnosis not present

## 2019-12-14 DIAGNOSIS — G2581 Restless legs syndrome: Secondary | ICD-10-CM | POA: Diagnosis not present

## 2019-12-19 DIAGNOSIS — G14 Postpolio syndrome: Secondary | ICD-10-CM | POA: Diagnosis not present

## 2019-12-19 DIAGNOSIS — G4700 Insomnia, unspecified: Secondary | ICD-10-CM | POA: Diagnosis not present

## 2019-12-19 DIAGNOSIS — I5031 Acute diastolic (congestive) heart failure: Secondary | ICD-10-CM | POA: Diagnosis not present

## 2019-12-19 DIAGNOSIS — F419 Anxiety disorder, unspecified: Secondary | ICD-10-CM | POA: Diagnosis not present

## 2019-12-19 DIAGNOSIS — U071 COVID-19: Secondary | ICD-10-CM | POA: Diagnosis not present

## 2019-12-19 DIAGNOSIS — E039 Hypothyroidism, unspecified: Secondary | ICD-10-CM | POA: Diagnosis not present

## 2019-12-19 DIAGNOSIS — J449 Chronic obstructive pulmonary disease, unspecified: Secondary | ICD-10-CM | POA: Diagnosis not present

## 2019-12-19 DIAGNOSIS — B0229 Other postherpetic nervous system involvement: Secondary | ICD-10-CM | POA: Diagnosis not present

## 2019-12-19 DIAGNOSIS — G2581 Restless legs syndrome: Secondary | ICD-10-CM | POA: Diagnosis not present

## 2019-12-24 DIAGNOSIS — U071 COVID-19: Secondary | ICD-10-CM | POA: Diagnosis not present

## 2019-12-24 DIAGNOSIS — G4700 Insomnia, unspecified: Secondary | ICD-10-CM | POA: Diagnosis not present

## 2019-12-24 DIAGNOSIS — I5031 Acute diastolic (congestive) heart failure: Secondary | ICD-10-CM | POA: Diagnosis not present

## 2019-12-24 DIAGNOSIS — E039 Hypothyroidism, unspecified: Secondary | ICD-10-CM | POA: Diagnosis not present

## 2019-12-24 DIAGNOSIS — J449 Chronic obstructive pulmonary disease, unspecified: Secondary | ICD-10-CM | POA: Diagnosis not present

## 2019-12-24 DIAGNOSIS — B0229 Other postherpetic nervous system involvement: Secondary | ICD-10-CM | POA: Diagnosis not present

## 2019-12-24 DIAGNOSIS — G2581 Restless legs syndrome: Secondary | ICD-10-CM | POA: Diagnosis not present

## 2019-12-24 DIAGNOSIS — G14 Postpolio syndrome: Secondary | ICD-10-CM | POA: Diagnosis not present

## 2019-12-24 DIAGNOSIS — F419 Anxiety disorder, unspecified: Secondary | ICD-10-CM | POA: Diagnosis not present

## 2019-12-25 DIAGNOSIS — G2581 Restless legs syndrome: Secondary | ICD-10-CM | POA: Diagnosis not present

## 2019-12-25 DIAGNOSIS — G14 Postpolio syndrome: Secondary | ICD-10-CM | POA: Diagnosis not present

## 2019-12-25 DIAGNOSIS — E039 Hypothyroidism, unspecified: Secondary | ICD-10-CM | POA: Diagnosis not present

## 2019-12-25 DIAGNOSIS — I5031 Acute diastolic (congestive) heart failure: Secondary | ICD-10-CM | POA: Diagnosis not present

## 2019-12-25 DIAGNOSIS — F419 Anxiety disorder, unspecified: Secondary | ICD-10-CM | POA: Diagnosis not present

## 2019-12-25 DIAGNOSIS — B0229 Other postherpetic nervous system involvement: Secondary | ICD-10-CM | POA: Diagnosis not present

## 2019-12-25 DIAGNOSIS — G4700 Insomnia, unspecified: Secondary | ICD-10-CM | POA: Diagnosis not present

## 2019-12-25 DIAGNOSIS — U071 COVID-19: Secondary | ICD-10-CM | POA: Diagnosis not present

## 2019-12-25 DIAGNOSIS — J449 Chronic obstructive pulmonary disease, unspecified: Secondary | ICD-10-CM | POA: Diagnosis not present

## 2019-12-26 ENCOUNTER — Ambulatory Visit (HOSPITAL_COMMUNITY)
Admission: RE | Admit: 2019-12-26 | Discharge: 2019-12-26 | Disposition: A | Discharge status: Home / Self Care | Payer: Medicare PPO | Source: Ambulatory Visit | Attending: Internal Medicine | Admitting: Internal Medicine

## 2019-12-26 ENCOUNTER — Other Ambulatory Visit: Payer: Self-pay

## 2019-12-26 ENCOUNTER — Other Ambulatory Visit (HOSPITAL_COMMUNITY): Payer: Self-pay | Admitting: Internal Medicine

## 2019-12-26 ENCOUNTER — Encounter (HOSPITAL_COMMUNITY): Payer: Self-pay

## 2019-12-26 DIAGNOSIS — Z20822 Contact with and (suspected) exposure to covid-19: Secondary | ICD-10-CM | POA: Diagnosis not present

## 2019-12-26 DIAGNOSIS — U071 COVID-19: Secondary | ICD-10-CM | POA: Diagnosis not present

## 2019-12-26 DIAGNOSIS — R918 Other nonspecific abnormal finding of lung field: Secondary | ICD-10-CM | POA: Diagnosis not present

## 2019-12-29 DIAGNOSIS — E441 Mild protein-calorie malnutrition: Secondary | ICD-10-CM | POA: Diagnosis not present

## 2019-12-29 DIAGNOSIS — J1282 Pneumonia due to coronavirus disease 2019: Secondary | ICD-10-CM | POA: Diagnosis not present

## 2020-01-09 DIAGNOSIS — F419 Anxiety disorder, unspecified: Secondary | ICD-10-CM | POA: Diagnosis not present

## 2020-01-09 DIAGNOSIS — G14 Postpolio syndrome: Secondary | ICD-10-CM | POA: Diagnosis not present

## 2020-01-09 DIAGNOSIS — J449 Chronic obstructive pulmonary disease, unspecified: Secondary | ICD-10-CM | POA: Diagnosis not present

## 2020-01-09 DIAGNOSIS — G2581 Restless legs syndrome: Secondary | ICD-10-CM | POA: Diagnosis not present

## 2020-01-09 DIAGNOSIS — E039 Hypothyroidism, unspecified: Secondary | ICD-10-CM | POA: Diagnosis not present

## 2020-01-09 DIAGNOSIS — B0229 Other postherpetic nervous system involvement: Secondary | ICD-10-CM | POA: Diagnosis not present

## 2020-01-09 DIAGNOSIS — I5031 Acute diastolic (congestive) heart failure: Secondary | ICD-10-CM | POA: Diagnosis not present

## 2020-01-09 DIAGNOSIS — U071 COVID-19: Secondary | ICD-10-CM | POA: Diagnosis not present

## 2020-01-09 DIAGNOSIS — G4700 Insomnia, unspecified: Secondary | ICD-10-CM | POA: Diagnosis not present

## 2020-01-11 DIAGNOSIS — B0229 Other postherpetic nervous system involvement: Secondary | ICD-10-CM | POA: Diagnosis not present

## 2020-01-11 DIAGNOSIS — J449 Chronic obstructive pulmonary disease, unspecified: Secondary | ICD-10-CM | POA: Diagnosis not present

## 2020-01-11 DIAGNOSIS — F419 Anxiety disorder, unspecified: Secondary | ICD-10-CM | POA: Diagnosis not present

## 2020-01-11 DIAGNOSIS — U071 COVID-19: Secondary | ICD-10-CM | POA: Diagnosis not present

## 2020-01-11 DIAGNOSIS — G2581 Restless legs syndrome: Secondary | ICD-10-CM | POA: Diagnosis not present

## 2020-01-11 DIAGNOSIS — G14 Postpolio syndrome: Secondary | ICD-10-CM | POA: Diagnosis not present

## 2020-01-11 DIAGNOSIS — G4700 Insomnia, unspecified: Secondary | ICD-10-CM | POA: Diagnosis not present

## 2020-01-11 DIAGNOSIS — I5031 Acute diastolic (congestive) heart failure: Secondary | ICD-10-CM | POA: Diagnosis not present

## 2020-01-11 DIAGNOSIS — E039 Hypothyroidism, unspecified: Secondary | ICD-10-CM | POA: Diagnosis not present

## 2020-01-16 ENCOUNTER — Other Ambulatory Visit: Payer: Self-pay

## 2020-01-17 DIAGNOSIS — E039 Hypothyroidism, unspecified: Secondary | ICD-10-CM | POA: Diagnosis not present

## 2020-01-17 DIAGNOSIS — I5031 Acute diastolic (congestive) heart failure: Secondary | ICD-10-CM | POA: Diagnosis not present

## 2020-01-17 DIAGNOSIS — F419 Anxiety disorder, unspecified: Secondary | ICD-10-CM | POA: Diagnosis not present

## 2020-01-17 DIAGNOSIS — J449 Chronic obstructive pulmonary disease, unspecified: Secondary | ICD-10-CM | POA: Diagnosis not present

## 2020-01-17 DIAGNOSIS — G4700 Insomnia, unspecified: Secondary | ICD-10-CM | POA: Diagnosis not present

## 2020-01-17 DIAGNOSIS — G2581 Restless legs syndrome: Secondary | ICD-10-CM | POA: Diagnosis not present

## 2020-01-17 DIAGNOSIS — U071 COVID-19: Secondary | ICD-10-CM | POA: Diagnosis not present

## 2020-01-17 DIAGNOSIS — G14 Postpolio syndrome: Secondary | ICD-10-CM | POA: Diagnosis not present

## 2020-01-17 DIAGNOSIS — B0229 Other postherpetic nervous system involvement: Secondary | ICD-10-CM | POA: Diagnosis not present

## 2020-01-26 DIAGNOSIS — J1282 Pneumonia due to coronavirus disease 2019: Secondary | ICD-10-CM | POA: Diagnosis not present

## 2020-01-26 DIAGNOSIS — R609 Edema, unspecified: Secondary | ICD-10-CM | POA: Diagnosis not present

## 2020-02-23 ENCOUNTER — Ambulatory Visit: Payer: Medicare PPO | Attending: Internal Medicine

## 2020-02-23 DIAGNOSIS — Z23 Encounter for immunization: Secondary | ICD-10-CM

## 2020-02-23 NOTE — Progress Notes (Signed)
   Covid-19 Vaccination Clinic  Name:  Sylvia Turner    MRN: [id number] DOB: 1936/02/02  02/23/2020  [name] was observed post Covid-19 immunization for 15 minutes without incident. She was provided with Vaccine Information Sheet and instruction to access the V-Safe system.   [name] was instructed to call 911 with any severe reactions post vaccine: Marland Kitchen Difficulty breathing  . Swelling of face and throat  . A fast heartbeat  . A bad rash all over body  . Dizziness and weakness   Immunizations Administered    Name Date Dose VIS Date Route   Moderna COVID-19 Vaccine 02/23/2020  9:55 AM 0.5 mL 10/2019 Intramuscular   Manufacturer: Moderna   Lot: [id number]   [place][id number]

## 2020-03-25 DIAGNOSIS — L723 Sebaceous cyst: Secondary | ICD-10-CM | POA: Diagnosis not present

## 2020-03-26 ENCOUNTER — Ambulatory Visit: Payer: Medicare PPO | Attending: Internal Medicine

## 2020-03-26 ENCOUNTER — Other Ambulatory Visit: Payer: Self-pay

## 2020-03-26 DIAGNOSIS — Z23 Encounter for immunization: Secondary | ICD-10-CM

## 2020-03-26 NOTE — Progress Notes (Signed)
   Covid-19 Vaccination Clinic  Name:  Sylvia Turner    MRN: [id number] DOB: [birth year]  03/26/2020  [name] was observed post Covid-19 immunization for 15 minutes without incident. She was provided with Vaccine Information Sheet and instruction to access the V-Safe system.   [name] was instructed to call 911 with any severe reactions post vaccine: Marland Kitchen Difficulty breathing  . Swelling of face and throat  . A fast heartbeat  . A bad rash all over body  . Dizziness and weakness   Immunizations Administered    Name Date Dose VIS Date Route   Moderna COVID-19 Vaccine 03/26/2020  9:50 AM 0.5 mL 10/2019 Intramuscular   Manufacturer: Moderna   Lot: [id number]   [place]:[id number]

## 2020-05-16 ENCOUNTER — Other Ambulatory Visit (HOSPITAL_COMMUNITY): Payer: Self-pay | Admitting: Internal Medicine

## 2020-05-16 DIAGNOSIS — U071 COVID-19: Secondary | ICD-10-CM

## 2020-05-21 ENCOUNTER — Other Ambulatory Visit: Payer: Self-pay

## 2020-05-21 ENCOUNTER — Ambulatory Visit (HOSPITAL_COMMUNITY)
Admission: RE | Admit: 2020-05-21 | Discharge: 2020-05-21 | Disposition: A | Discharge status: Home / Self Care | Payer: Medicare PPO | Source: Ambulatory Visit | Attending: Internal Medicine | Admitting: Internal Medicine

## 2020-05-21 DIAGNOSIS — J1282 Pneumonia due to coronavirus disease 2019: Secondary | ICD-10-CM | POA: Diagnosis not present

## 2020-05-21 DIAGNOSIS — U071 COVID-19: Secondary | ICD-10-CM | POA: Insufficient documentation

## 2020-05-21 DIAGNOSIS — J189 Pneumonia, unspecified organism: Secondary | ICD-10-CM | POA: Diagnosis not present

## 2020-06-11 DIAGNOSIS — E039 Hypothyroidism, unspecified: Secondary | ICD-10-CM | POA: Diagnosis not present

## 2020-06-11 DIAGNOSIS — R7301 Impaired fasting glucose: Secondary | ICD-10-CM | POA: Diagnosis not present

## 2020-06-11 DIAGNOSIS — Z79899 Other long term (current) drug therapy: Secondary | ICD-10-CM | POA: Diagnosis not present

## 2020-06-11 DIAGNOSIS — G4700 Insomnia, unspecified: Secondary | ICD-10-CM | POA: Diagnosis not present

## 2020-06-11 DIAGNOSIS — G2581 Restless legs syndrome: Secondary | ICD-10-CM | POA: Diagnosis not present

## 2020-06-18 DIAGNOSIS — R7301 Impaired fasting glucose: Secondary | ICD-10-CM | POA: Diagnosis not present

## 2020-06-18 DIAGNOSIS — G2581 Restless legs syndrome: Secondary | ICD-10-CM | POA: Diagnosis not present

## 2020-06-18 DIAGNOSIS — E039 Hypothyroidism, unspecified: Secondary | ICD-10-CM | POA: Diagnosis not present

## 2020-06-18 DIAGNOSIS — Z0001 Encounter for general adult medical examination with abnormal findings: Secondary | ICD-10-CM | POA: Diagnosis not present

## 2020-06-18 DIAGNOSIS — Z0000 Encounter for general adult medical examination without abnormal findings: Secondary | ICD-10-CM | POA: Diagnosis not present

## 2020-09-16 DIAGNOSIS — E039 Hypothyroidism, unspecified: Secondary | ICD-10-CM | POA: Diagnosis not present

## 2020-09-16 DIAGNOSIS — Z79899 Other long term (current) drug therapy: Secondary | ICD-10-CM | POA: Diagnosis not present

## 2020-09-23 DIAGNOSIS — E039 Hypothyroidism, unspecified: Secondary | ICD-10-CM | POA: Diagnosis not present

## 2020-09-23 DIAGNOSIS — Z23 Encounter for immunization: Secondary | ICD-10-CM | POA: Diagnosis not present

## 2020-12-17 DIAGNOSIS — Z79899 Other long term (current) drug therapy: Secondary | ICD-10-CM | POA: Diagnosis not present

## 2020-12-17 DIAGNOSIS — E039 Hypothyroidism, unspecified: Secondary | ICD-10-CM | POA: Diagnosis not present

## 2020-12-24 DIAGNOSIS — E039 Hypothyroidism, unspecified: Secondary | ICD-10-CM | POA: Diagnosis not present

## 2020-12-27 DIAGNOSIS — H524 Presbyopia: Secondary | ICD-10-CM | POA: Diagnosis not present

## 2020-12-27 DIAGNOSIS — H52209 Unspecified astigmatism, unspecified eye: Secondary | ICD-10-CM | POA: Diagnosis not present

## 2020-12-27 DIAGNOSIS — H5213 Myopia, bilateral: Secondary | ICD-10-CM | POA: Diagnosis not present

## 2021-01-20 DIAGNOSIS — H9190 Unspecified hearing loss, unspecified ear: Secondary | ICD-10-CM | POA: Diagnosis not present

## 2021-01-20 DIAGNOSIS — H95199 Other disorders following mastoidectomy, unspecified ear: Secondary | ICD-10-CM | POA: Diagnosis not present

## 2021-01-28 DIAGNOSIS — H9072 Mixed conductive and sensorineural hearing loss, unilateral, left ear, with unrestricted hearing on the contralateral side: Secondary | ICD-10-CM | POA: Diagnosis not present

## 2021-03-01 DIAGNOSIS — K219 Gastro-esophageal reflux disease without esophagitis: Secondary | ICD-10-CM | POA: Diagnosis not present

## 2021-03-01 DIAGNOSIS — E039 Hypothyroidism, unspecified: Secondary | ICD-10-CM | POA: Diagnosis not present

## 2021-04-05 ENCOUNTER — Emergency Department (HOSPITAL_COMMUNITY)
Admission: EM | Admit: 2021-04-05 | Discharge: 2021-04-05 | Disposition: A | Discharge status: Home / Self Care | Payer: Medicare HMO | Attending: Emergency Medicine | Admitting: Emergency Medicine

## 2021-04-05 ENCOUNTER — Other Ambulatory Visit: Payer: Self-pay

## 2021-04-05 ENCOUNTER — Encounter (HOSPITAL_COMMUNITY): Payer: Self-pay | Admitting: Emergency Medicine

## 2021-04-05 DIAGNOSIS — W57XXXA Bitten or stung by nonvenomous insect and other nonvenomous arthropods, initial encounter: Secondary | ICD-10-CM | POA: Insufficient documentation

## 2021-04-05 DIAGNOSIS — Z7982 Long term (current) use of aspirin: Secondary | ICD-10-CM | POA: Insufficient documentation

## 2021-04-05 DIAGNOSIS — L089 Local infection of the skin and subcutaneous tissue, unspecified: Secondary | ICD-10-CM | POA: Diagnosis not present

## 2021-04-05 DIAGNOSIS — Z87891 Personal history of nicotine dependence: Secondary | ICD-10-CM | POA: Insufficient documentation

## 2021-04-05 DIAGNOSIS — S70362A Insect bite (nonvenomous), left thigh, initial encounter: Secondary | ICD-10-CM | POA: Diagnosis not present

## 2021-04-05 MED ORDER — DOXYCYCLINE HYCLATE 100 MG PO CAPS: 100.0000 mg | ORAL_CAPSULE | Freq: Two times a day (BID) | ORAL | 0 refills | Status: DC

## 2021-04-05 MED ORDER — DOXYCYCLINE HYCLATE 100 MG PO TABS
100.0000 mg | ORAL_TABLET | Freq: Once | ORAL | Status: AC
  Administered 2021-04-05: 100 mg via ORAL
  Filled 2021-04-05: qty 1

## 2021-04-05 NOTE — Discharge Instructions (Addendum)
Take the entire course of the antibiotics.  .  I also recommend either Benadryl itch cream or [place] anti-itch cream.  A gentle warm compress may also help with discomfort, 15 minutes several times daily.  Plan to get rechecked by your primary doctor if the antibiotics are not starting to make this area smaller within the next 1 to 2 days.  Be aware that it may get a little bit larger over the next 24 hours before the antibiotic starts to work.

## 2021-04-05 NOTE — ED Triage Notes (Signed)
Pt c/o removing tick from left inner thigh on Wednesday. Pt c/o severe itching, redness and swelling.

## 2021-04-07 NOTE — ED Provider Notes (Signed)
[facility] EMERGENCY DEPARTMENT Provider Note   CSN: [id number] Arrival date & time: 04/05/21  1958     History No chief complaint on file.   Sylvia Turner is a 85 y.o. female presenting with complaint of a tick bite.  She removed a tick from her left medial thigh 4 days ago, but continues to have an expanding patch of red, itchy, warm skin surrounding the bite site.  She denies fevers, chills, rash, headache, also no n/v or other complaint.  She has had no treatment prior to arrival.   HPI     Past Medical History:  Diagnosis Date  . Deaf L Ear  . Hip problem   . Post herpetic neuralgia     Patient Active Problem List   Diagnosis Date Noted  . Acute respiratory disease due to COVID-19 virus 11/13/2019  . Postherpetic neuralgia at T3-T5 level 10/17/2014    Past Surgical History:  Procedure Laterality Date  . CHOLECYSTECTOMY    . EXTERNAL EAR SURGERY       OB History   No obstetric history on file.     Family History  Problem Relation Age of Onset  . Dementia Mother   . Other Father        Black Lung    Social History   Tobacco Use  . Smoking status: Former Smoker    Packs/day: 0.50    Years: 10.00    Pack years: 5.00    Types: Cigarettes    Quit date: 11/02/1988    Years since quitting: 32.4  . Smokeless tobacco: Never Used  Substance Use Topics  . Alcohol use: Yes    Alcohol/week: 0.0 standard drinks    Comment: rarely 1 glass of wine  . Drug use: No    Home Medications Prior to Admission medications   Medication Sig Start Date End Date Taking? Authorizing Provider  doxycycline (VIBRAMYCIN) 100 MG capsule Take 1 capsule (100 mg total) by mouth 2 (two) times daily. 04/05/21  Yes Ezrie Bunyan, Almyra Free, PA-C  aspirin 81 MG chewable tablet Chew 1 tablet (81 mg total) by mouth daily. 11/19/19   Elgergawy, Silver Huguenin, MD  Cyanocobalamin (VITAMIN B-12 PO) Take 1 tablet by mouth daily.    [provider]  dexamethasone (DECADRON) 6 MG tablet Take 1 tablet (6  mg total) by mouth daily. 11/19/19   Elgergawy, Silver Huguenin, MD  furosemide (LASIX) 20 MG tablet Please take 40 mg oral daily x7 days,then  20 mg oral daily  x7 days 11/18/19   Elgergawy, Silver Huguenin, MD  gabapentin (NEURONTIN) 300 MG capsule Take 300 mg by mouth at bedtime. 09/26/19   [provider]  levothyroxine (SYNTHROID, LEVOTHROID) 100 MCG tablet Take 100 mcg by mouth daily before breakfast.    [provider]  MAGNESIUM PO Take 1 tablet by mouth daily.    [provider]  pantoprazole (PROTONIX) 40 MG tablet Take 1 tablet (40 mg total) by mouth daily. 11/18/19 12/18/19  Elgergawy, Silver Huguenin, MD  pramipexole (MIRAPEX) 0.25 MG tablet Take 2 tablets by mouth every evening.  10/10/14   [provider]  traZODone (DESYREL) 50 MG tablet Take 1 tablet by mouth at bedtime. 10/11/14   [provider]  TURMERIC PO Take 1 tablet by mouth daily.    [provider]  valACYclovir (VALTREX) 1000 MG tablet Take 1 tablet (1,000 mg total) by mouth daily. 11/18/19   Elgergawy, Silver Huguenin, MD    Allergies    Hydrocodone  and Other  Review of Systems   Review of Systems  Constitutional: Negative for chills and fever.  Respiratory: Negative for shortness of breath and wheezing.   Skin: Positive for color change.  Neurological: Negative for numbness.  All other systems reviewed and are negative.   Physical Exam Updated Vital Signs BP (!) 162/70 (BP Location: Left Arm)   Pulse 89   Temp 98 F (36.7 C) (Oral)   Resp 19   Ht 5\' 2"  (1.575 m)   Wt 59.1 kg   SpO2 100%   BMI 23.83 kg/m   Physical Exam Constitutional:      General: She is not in acute distress.    Appearance: She is well-developed.  HENT:     Head: Normocephalic.  Cardiovascular:     Rate and Rhythm: Normal rate.  Pulmonary:     Effort: Pulmonary effort is normal.     Breath sounds: No wheezing.  Musculoskeletal:        General: Normal range of motion.     Cervical back: Neck supple.   Skin:    Findings: Erythema present. No abscess or rash.     Comments: 6 cm area of erythema surrounding a central bite site, no red streaking or drainage,  no induration or retained fb.       ED Results / Procedures / Treatments   Labs (all labs ordered are listed, but only abnormal results are displayed) Labs Reviewed - No data to display  EKG None  Radiology No results found.  Procedures Procedures   Medications Ordered in ED Medications  doxycycline (VIBRA-TABS) tablet 100 mg (100 mg Oral Given 04/05/21 2100)    ED Course  I have reviewed the triage vital signs and the nursing notes.  Pertinent labs & imaging results that were available during my care of the patient were reviewed by me and considered in my medical decision making (see chart for details).    MDM Rules/Calculators/A&P                          Tick bite with surrounding erythema, possible simple inflammatory reaction, but cannot exclude cellulitis or possible RMSF exposure. She was covered for this possibility with doxycycline.  Discussed home tx of the wound site, recheck for any persistent or worsened sx.    The patient appears reasonably screened and/or stabilized for discharge and I doubt any other medical condition or other [facility] requiring further screening, evaluation, or treatment in the ED at this time prior to discharge.  Final Clinical Impression(s) / ED Diagnoses Final diagnoses:  Infected tick bite, initial encounter    Rx / DC Orders ED Discharge Orders         Ordered    doxycycline (VIBRAMYCIN) 100 MG capsule  2 times daily        04/05/21 2053           Evalee Jefferson, Hershal Coria 04/07/21 1228    Milton Ferguson, MD 04/08/21 2336

## 2021-06-13 DIAGNOSIS — R7301 Impaired fasting glucose: Secondary | ICD-10-CM | POA: Diagnosis not present

## 2021-06-13 DIAGNOSIS — Z79899 Other long term (current) drug therapy: Secondary | ICD-10-CM | POA: Diagnosis not present

## 2021-06-13 DIAGNOSIS — E039 Hypothyroidism, unspecified: Secondary | ICD-10-CM | POA: Diagnosis not present

## 2021-06-13 DIAGNOSIS — G2581 Restless legs syndrome: Secondary | ICD-10-CM | POA: Diagnosis not present

## 2021-06-13 DIAGNOSIS — G4700 Insomnia, unspecified: Secondary | ICD-10-CM | POA: Diagnosis not present

## 2021-06-16 ENCOUNTER — Other Ambulatory Visit (HOSPITAL_COMMUNITY): Payer: Self-pay | Admitting: Internal Medicine

## 2021-06-16 DIAGNOSIS — Z1231 Encounter for screening mammogram for malignant neoplasm of breast: Secondary | ICD-10-CM

## 2021-06-18 ENCOUNTER — Ambulatory Visit (HOSPITAL_COMMUNITY)
Admission: RE | Admit: 2021-06-18 | Discharge: 2021-06-18 | Disposition: A | Discharge status: Home / Self Care | Payer: Medicare HMO | Source: Ambulatory Visit | Attending: Internal Medicine | Admitting: Internal Medicine

## 2021-06-18 ENCOUNTER — Other Ambulatory Visit: Payer: Self-pay

## 2021-06-18 DIAGNOSIS — Z1231 Encounter for screening mammogram for malignant neoplasm of breast: Secondary | ICD-10-CM | POA: Diagnosis not present

## 2021-06-20 ENCOUNTER — Other Ambulatory Visit (HOSPITAL_COMMUNITY): Payer: Self-pay | Admitting: Internal Medicine

## 2021-06-20 DIAGNOSIS — Z6824 Body mass index (BMI) 24.0-24.9, adult: Secondary | ICD-10-CM | POA: Diagnosis not present

## 2021-06-20 DIAGNOSIS — N950 Postmenopausal bleeding: Secondary | ICD-10-CM

## 2021-06-20 DIAGNOSIS — Z0000 Encounter for general adult medical examination without abnormal findings: Secondary | ICD-10-CM | POA: Diagnosis not present

## 2021-06-20 DIAGNOSIS — N390 Urinary tract infection, site not specified: Secondary | ICD-10-CM | POA: Diagnosis not present

## 2021-06-20 DIAGNOSIS — G2581 Restless legs syndrome: Secondary | ICD-10-CM | POA: Diagnosis not present

## 2021-06-20 DIAGNOSIS — E039 Hypothyroidism, unspecified: Secondary | ICD-10-CM | POA: Diagnosis not present

## 2021-06-26 ENCOUNTER — Other Ambulatory Visit: Payer: Self-pay

## 2021-06-26 ENCOUNTER — Ambulatory Visit (HOSPITAL_COMMUNITY)
Admission: RE | Admit: 2021-06-26 | Discharge: 2021-06-26 | Disposition: A | Discharge status: Home / Self Care | Payer: Medicare HMO | Source: Ambulatory Visit | Attending: Internal Medicine | Admitting: Internal Medicine

## 2021-06-26 DIAGNOSIS — M8588 Other specified disorders of bone density and structure, other site: Secondary | ICD-10-CM | POA: Diagnosis not present

## 2021-06-26 DIAGNOSIS — Z780 Asymptomatic menopausal state: Secondary | ICD-10-CM | POA: Diagnosis not present

## 2021-06-26 DIAGNOSIS — N950 Postmenopausal bleeding: Secondary | ICD-10-CM | POA: Diagnosis not present

## 2021-07-09 DIAGNOSIS — L308 Other specified dermatitis: Secondary | ICD-10-CM | POA: Diagnosis not present

## 2021-07-09 DIAGNOSIS — L218 Other seborrheic dermatitis: Secondary | ICD-10-CM | POA: Diagnosis not present

## 2021-09-01 DIAGNOSIS — E039 Hypothyroidism, unspecified: Secondary | ICD-10-CM | POA: Diagnosis not present

## 2021-09-01 DIAGNOSIS — K219 Gastro-esophageal reflux disease without esophagitis: Secondary | ICD-10-CM | POA: Diagnosis not present

## 2021-09-10 IMAGING — DX DG CHEST 1V PORT
1 series · 1 of 1 positions shown · non-contrast
Comparison: 01/17/2008

CLINICAL DATA: Pt c/o worsening SOB. Pt diagnosed with COVID [DATE].
Hx former smoker cough, sob, covid +

EXAM:
PORTABLE CHEST 1 VIEW

[chest ap]
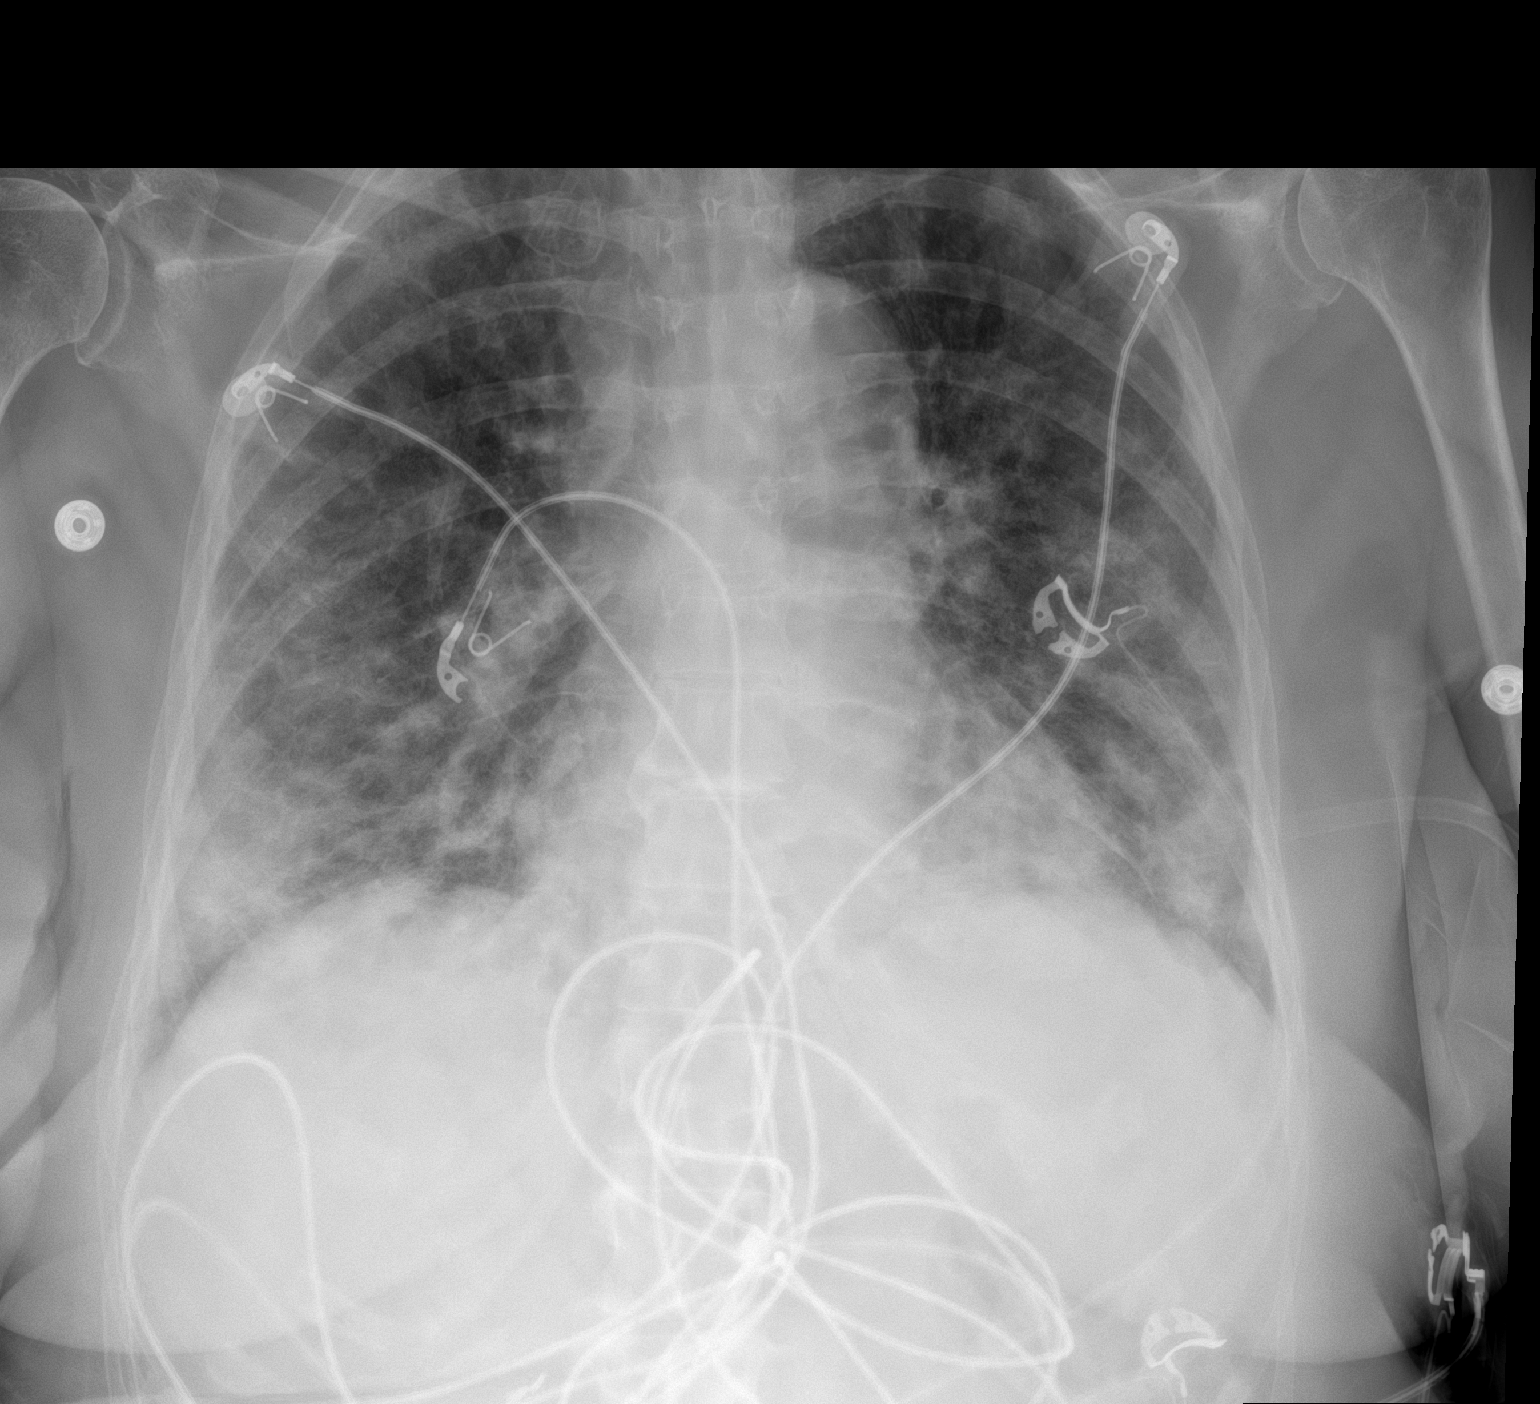

[1 of 1 positions shown; findings below may reference images not displayed]

FINDINGS: Normal cardiac silhouette. There is patchy bilateral peripheral
nodular airspace densities. No pleural fluid. No pneumothorax. No
acute osseous abnormality.
IMPRESSION: Patchy peripheral nodular airspace disease consistent with COVID
viral pneumonia.

## 2021-09-29 DIAGNOSIS — E039 Hypothyroidism, unspecified: Secondary | ICD-10-CM | POA: Diagnosis not present

## 2021-10-06 DIAGNOSIS — E039 Hypothyroidism, unspecified: Secondary | ICD-10-CM | POA: Diagnosis not present

## 2021-10-06 DIAGNOSIS — Z23 Encounter for immunization: Secondary | ICD-10-CM | POA: Diagnosis not present

## 2021-10-06 DIAGNOSIS — G2581 Restless legs syndrome: Secondary | ICD-10-CM | POA: Diagnosis not present

## 2021-10-23 IMAGING — DX DG CHEST 2V
2 series · 2 of 2 positions shown · non-contrast
Comparison: 11/13/2019 chest radiograph.

CLINICAL DATA: RRHWL-SY in [REDACTED], still requiring oxygen.
Follow-up.

EXAM:
CHEST - 2 VIEW

[chest pa]
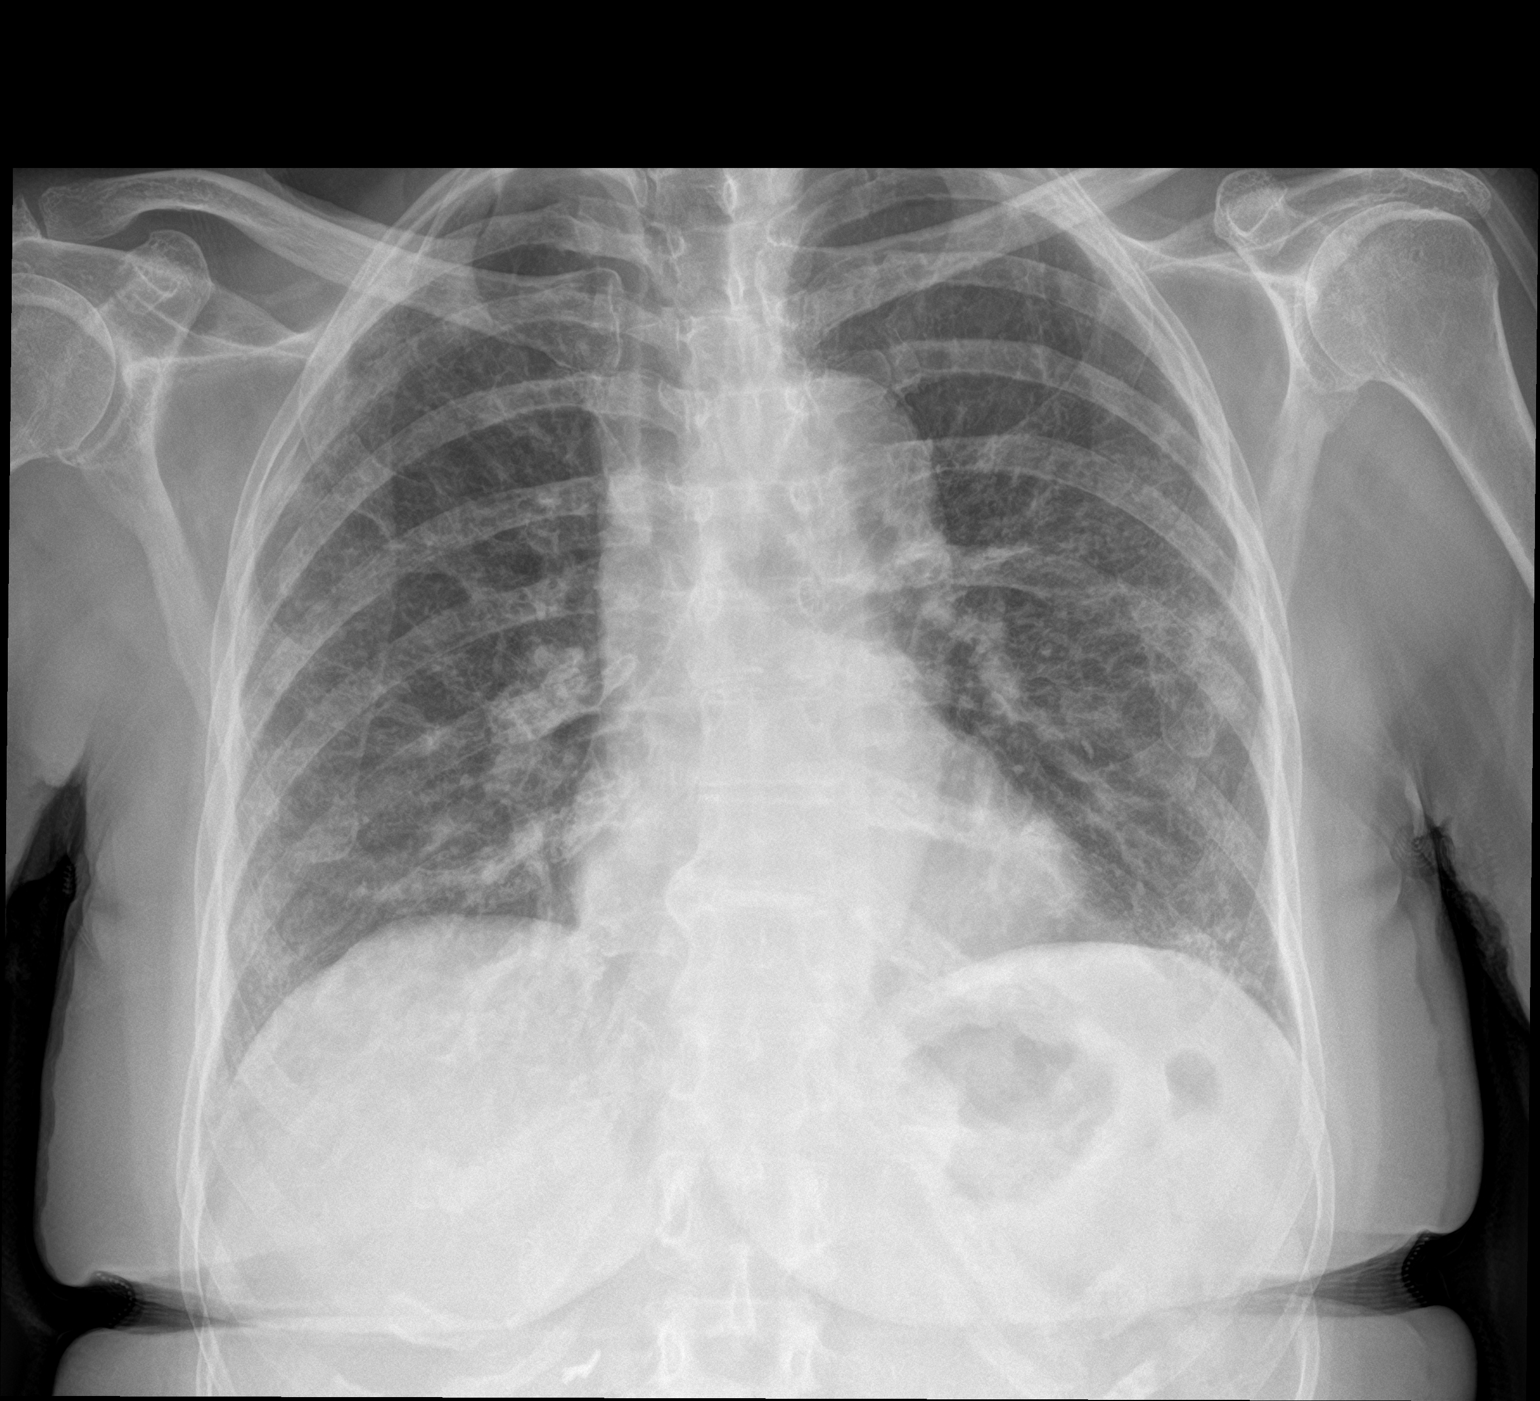

[chest lat]
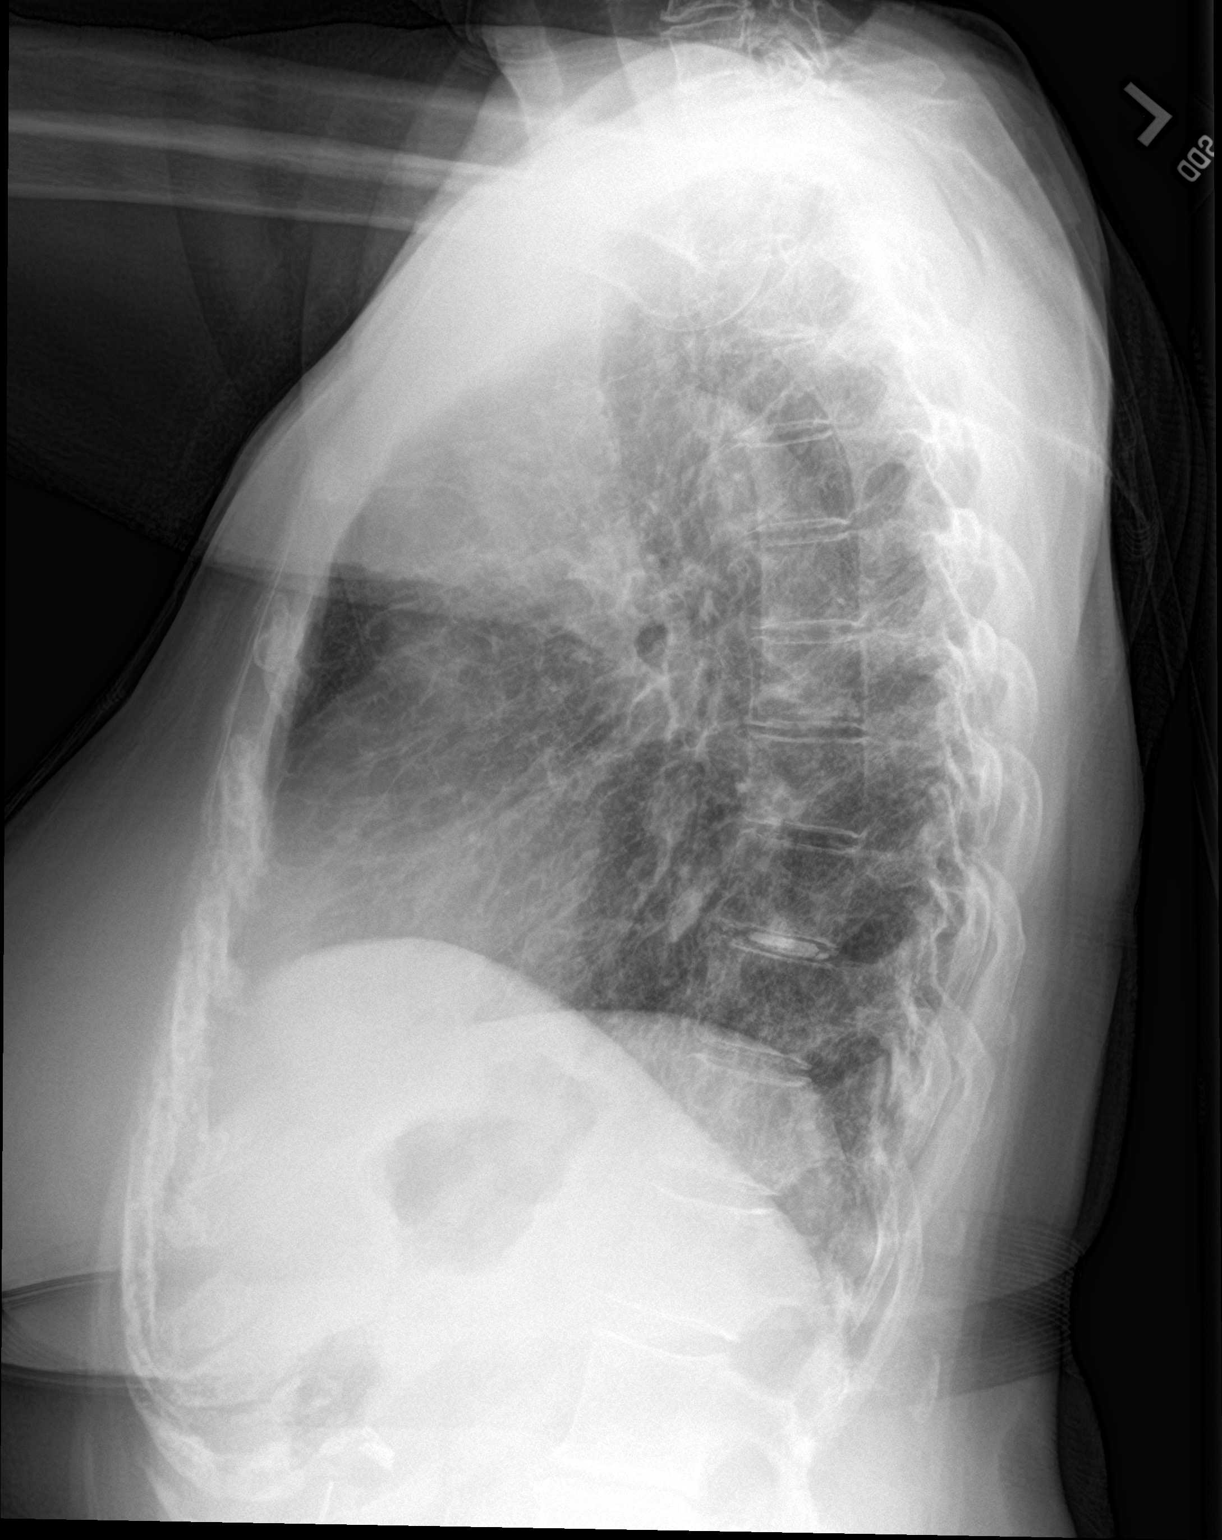

[2 of 2 positions shown; findings below may reference images not displayed]

FINDINGS: Stable cardiomediastinal silhouette with normal heart size. No
pneumothorax. No pleural effusion. Patchy hazy opacities in the
peripheral lungs bilaterally have decreased in the interval.
IMPRESSION: Interval decrease in patchy hazy peripheral lung opacities,
compatible with slowly resolving RRHWL-SY pneumonia. Follow-up chest
radiographs or high-resolution chest CT may be considered in 3-6
months to assess for residual postinflammatory fibrosis, as
clinically warranted.

## 2021-11-30 DIAGNOSIS — E039 Hypothyroidism, unspecified: Secondary | ICD-10-CM | POA: Diagnosis not present

## 2021-11-30 DIAGNOSIS — K219 Gastro-esophageal reflux disease without esophagitis: Secondary | ICD-10-CM | POA: Diagnosis not present

## 2021-12-30 DIAGNOSIS — Z0001 Encounter for general adult medical examination with abnormal findings: Secondary | ICD-10-CM | POA: Diagnosis not present

## 2021-12-30 DIAGNOSIS — E039 Hypothyroidism, unspecified: Secondary | ICD-10-CM | POA: Diagnosis not present

## 2021-12-30 DIAGNOSIS — K219 Gastro-esophageal reflux disease without esophagitis: Secondary | ICD-10-CM | POA: Diagnosis not present

## 2022-09-11 ENCOUNTER — Other Ambulatory Visit (HOSPITAL_COMMUNITY): Payer: Self-pay | Admitting: Internal Medicine

## 2022-09-11 DIAGNOSIS — Z1231 Encounter for screening mammogram for malignant neoplasm of breast: Secondary | ICD-10-CM

## 2022-09-16 DIAGNOSIS — H25013 Cortical age-related cataract, bilateral: Secondary | ICD-10-CM | POA: Diagnosis not present

## 2022-09-16 DIAGNOSIS — H524 Presbyopia: Secondary | ICD-10-CM | POA: Diagnosis not present

## 2022-09-21 ENCOUNTER — Ambulatory Visit (HOSPITAL_COMMUNITY): Payer: Medicare HMO

## 2022-09-29 DIAGNOSIS — Z79899 Other long term (current) drug therapy: Secondary | ICD-10-CM | POA: Diagnosis not present

## 2022-09-29 DIAGNOSIS — G2581 Restless legs syndrome: Secondary | ICD-10-CM | POA: Diagnosis not present

## 2022-09-29 DIAGNOSIS — R7301 Impaired fasting glucose: Secondary | ICD-10-CM | POA: Diagnosis not present

## 2022-09-29 DIAGNOSIS — E039 Hypothyroidism, unspecified: Secondary | ICD-10-CM | POA: Diagnosis not present

## 2022-09-29 DIAGNOSIS — G4700 Insomnia, unspecified: Secondary | ICD-10-CM | POA: Diagnosis not present

## 2022-10-06 DIAGNOSIS — Z23 Encounter for immunization: Secondary | ICD-10-CM | POA: Diagnosis not present

## 2022-10-06 DIAGNOSIS — Z0000 Encounter for general adult medical examination without abnormal findings: Secondary | ICD-10-CM | POA: Diagnosis not present

## 2022-10-06 DIAGNOSIS — G4700 Insomnia, unspecified: Secondary | ICD-10-CM | POA: Diagnosis not present

## 2022-10-06 DIAGNOSIS — G2581 Restless legs syndrome: Secondary | ICD-10-CM | POA: Diagnosis not present

## 2022-10-06 DIAGNOSIS — C4491 Basal cell carcinoma of skin, unspecified: Secondary | ICD-10-CM | POA: Diagnosis not present

## 2022-10-06 DIAGNOSIS — E039 Hypothyroidism, unspecified: Secondary | ICD-10-CM | POA: Diagnosis not present

## 2022-10-14 DIAGNOSIS — L308 Other specified dermatitis: Secondary | ICD-10-CM | POA: Diagnosis not present

## 2022-10-14 DIAGNOSIS — C4441 Basal cell carcinoma of skin of scalp and neck: Secondary | ICD-10-CM | POA: Diagnosis not present

## 2022-10-14 DIAGNOSIS — L218 Other seborrheic dermatitis: Secondary | ICD-10-CM | POA: Diagnosis not present

## 2022-10-29 DIAGNOSIS — H01005 Unspecified blepharitis left lower eyelid: Secondary | ICD-10-CM | POA: Diagnosis not present

## 2022-10-29 DIAGNOSIS — H01004 Unspecified blepharitis left upper eyelid: Secondary | ICD-10-CM | POA: Diagnosis not present

## 2022-10-29 DIAGNOSIS — H01001 Unspecified blepharitis right upper eyelid: Secondary | ICD-10-CM | POA: Diagnosis not present

## 2022-10-29 DIAGNOSIS — H2513 Age-related nuclear cataract, bilateral: Secondary | ICD-10-CM | POA: Diagnosis not present

## 2022-10-29 DIAGNOSIS — H01002 Unspecified blepharitis right lower eyelid: Secondary | ICD-10-CM | POA: Diagnosis not present

## 2022-11-12 DIAGNOSIS — H25811 Combined forms of age-related cataract, right eye: Secondary | ICD-10-CM | POA: Diagnosis not present

## 2022-11-16 ENCOUNTER — Encounter (HOSPITAL_COMMUNITY)
Admission: RE | Admit: 2022-11-16 | Discharge: 2022-11-16 | Disposition: A | Discharge status: Home / Self Care | Payer: Medicare HMO | Source: Ambulatory Visit | Attending: Ophthalmology | Admitting: Ophthalmology

## 2022-11-18 NOTE — H&P (Signed)
Surgical History & Physical  Patient Name: Sylvia Turner DOB: 03-14-1936  Surgery: Cataract extraction with intraocular lens implant phacoemulsification; Right Eye  Surgeon: Baruch Goldmann MD Surgery Date:  11-20-22 Pre-Op Date:  10-29-22  HPI: A 44 Yr. old female patient 1. The patient was referred from Dr. Volanda Napoleon at [facility] for a cataract evaluation of both eyes.The patient's vision is blurry. The complaint is associated with difficulty experiencing glare on bright sunny days, difficulty reading traffic signs/street signs, and difficulty driving at night due to halos/glare. This is negatively affecting the patient's quality of life and the patient is unable to function adequately in life with the current level of vision. HPI was performed by Baruch Goldmann .  Medical History: Cataracts no known family hx of ARMD/Glaucoma Thyroid Problems  Review of Systems Negative Allergic/Immunologic Negative Cardiovascular Negative Constitutional Negative Ear, Nose, Mouth & Throat Negative Endocrine Negative Eyes Negative Gastrointestinal Negative Genitourinary Negative Hemotologic/Lymphatic Negative Integumentary Negative Musculoskeletal Negative Neurological Negative Psychiatry Negative Respiratory  Social   Smoker, current status unknown   Medication No gtts,  Synthroid, Pramipexole, Trazodone,   Sx/Procedures  None  Drug Allergies   NKDA  History & Physical: Heent: cataract, right eye NECK: supple without bruits LUNGS: lungs clear to auscultation CV: regular rate and rhythm Abdomen: soft and non-tender Impression & Plan: Assessment: 1.  NUCLEAR SCLEROSIS AGE RELATED; Both Eyes (H25.13) 2.  BLEPHARITIS; Right Upper Lid, Right Lower Lid, Left Upper Lid, Left Lower Lid (H01.001, H01.002,H01.004,H01.005) 3.  Myopia; Both Eyes (H52.13) 4.  Presbyopia ; Both Eyes (H52.4)  Plan: 1.  Cataract accounts for the patient's decreased vision. This visual impairment is not  correctable with a tolerable change in glasses or contact lenses. Cataract surgery with an implantation of a new lens should significantly improve the visual and functional status of the patient. Discussed Turner risks, benefits, alternatives, and potential complications. Discussed the procedures and recovery. Patient desires to have surgery. A-scan ordered and performed today for intra-ocular lens calculations. The surgery will be performed in order to improve vision for driving, reading, and for eye examinations. Recommend phacoemulsification with intra-ocular lens. Recommend Dextenza for post-operative pain and inflammation. Right Eye worse - first. Dilates well - shugarcaine by protocol. Seat Near goal for -2.00. Myopic.  2.  Begin frequent lid scrubs  3.  Proceeding with surgery  4.  As above

## 2022-11-20 ENCOUNTER — Ambulatory Visit (HOSPITAL_COMMUNITY): Payer: Medicare HMO | Admitting: Certified Registered Nurse Anesthetist

## 2022-11-20 ENCOUNTER — Encounter (HOSPITAL_COMMUNITY)
Admission: RE | Disposition: A | Discharge status: Home / Self Care | Payer: Self-pay | Source: Home / Self Care | Attending: Ophthalmology

## 2022-11-20 ENCOUNTER — Ambulatory Visit (HOSPITAL_BASED_OUTPATIENT_CLINIC_OR_DEPARTMENT_OTHER): Payer: Medicare HMO | Admitting: Certified Registered Nurse Anesthetist

## 2022-11-20 ENCOUNTER — Ambulatory Visit (HOSPITAL_COMMUNITY)
Admission: RE | Admit: 2022-11-20 | Discharge: 2022-11-20 | Disposition: A | Discharge status: Home / Self Care | Payer: Medicare HMO | Attending: Ophthalmology | Admitting: Ophthalmology

## 2022-11-20 DIAGNOSIS — E039 Hypothyroidism, unspecified: Secondary | ICD-10-CM | POA: Diagnosis not present

## 2022-11-20 DIAGNOSIS — H524 Presbyopia: Secondary | ICD-10-CM | POA: Insufficient documentation

## 2022-11-20 DIAGNOSIS — H25811 Combined forms of age-related cataract, right eye: Secondary | ICD-10-CM | POA: Diagnosis not present

## 2022-11-20 DIAGNOSIS — H0100A Unspecified blepharitis right eye, upper and lower eyelids: Secondary | ICD-10-CM | POA: Insufficient documentation

## 2022-11-20 DIAGNOSIS — H0100B Unspecified blepharitis left eye, upper and lower eyelids: Secondary | ICD-10-CM | POA: Diagnosis not present

## 2022-11-20 DIAGNOSIS — H5213 Myopia, bilateral: Secondary | ICD-10-CM | POA: Insufficient documentation

## 2022-11-20 HISTORY — PX: CATARACT EXTRACTION W/PHACO: SHX586

## 2022-11-20 SURGERY — PHACOEMULSIFICATION, CATARACT, WITH IOL INSERTION
Anesthesia: Monitor Anesthesia Care | Site: Eye | Laterality: Right

## 2022-11-20 MED ORDER — LIDOCAINE HCL (PF) 1 % IJ SOLN
INTRAOCULAR | Status: DC | PRN
  Administered 2022-11-20: 1 mL via OPHTHALMIC

## 2022-11-20 MED ORDER — BSS IO SOLN
INTRAOCULAR | Status: DC | PRN
  Administered 2022-11-20: 15 mL via INTRAOCULAR

## 2022-11-20 MED ORDER — STERILE WATER FOR IRRIGATION IR SOLN
Status: DC | PRN
  Administered 2022-11-20: 250 mL

## 2022-11-20 MED ORDER — EPINEPHRINE PF 1 MG/ML IJ SOLN
INTRAOCULAR | Status: DC | PRN
  Administered 2022-11-20: 500 mL

## 2022-11-20 MED ORDER — MOXIFLOXACIN HCL 0.5 % OP SOLN
OPHTHALMIC | Status: DC | PRN
  Administered 2022-11-20: .2 mL via OPHTHALMIC

## 2022-11-20 MED ORDER — PHENYLEPHRINE HCL 2.5 % OP SOLN
1.0000 [drp] | OPHTHALMIC | Status: AC | PRN
  Administered 2022-11-20 (×3): 1 [drp] via OPHTHALMIC

## 2022-11-20 MED ORDER — TROPICAMIDE 1 % OP SOLN
1.0000 [drp] | OPHTHALMIC | Status: AC | PRN
  Administered 2022-11-20 (×3): 1 [drp] via OPHTHALMIC

## 2022-11-20 MED ORDER — LIDOCAINE HCL 3.5 % OP GEL
1.0000 | Freq: Once | OPHTHALMIC | Status: AC
  Administered 2022-11-20: 1 via OPHTHALMIC

## 2022-11-20 MED ORDER — TETRACAINE HCL 0.5 % OP SOLN
1.0000 [drp] | OPHTHALMIC | Status: AC | PRN
  Administered 2022-11-20 (×3): 1 [drp] via OPHTHALMIC

## 2022-11-20 MED ORDER — SODIUM HYALURONATE 23MG/ML IO SOSY
PREFILLED_SYRINGE | INTRAOCULAR | Status: DC | PRN
  Administered 2022-11-20: .6 mL via INTRAOCULAR

## 2022-11-20 MED ORDER — SODIUM HYALURONATE 10 MG/ML IO SOLUTION
PREFILLED_SYRINGE | INTRAOCULAR | Status: DC | PRN
  Administered 2022-11-20: .85 mL via INTRAOCULAR

## 2022-11-20 MED ORDER — MOXIFLOXACIN HCL 5 MG/ML IO SOLN
INTRAOCULAR | Status: AC
  Filled 2022-11-20: qty 1

## 2022-11-20 MED ORDER — POVIDONE-IODINE 5 % OP SOLN
OPHTHALMIC | Status: DC | PRN
  Administered 2022-11-20: 1 via OPHTHALMIC

## 2022-11-20 SURGICAL SUPPLY — 15 items
CATARACT SUITE SIGHTPATH (MISCELLANEOUS) ×1 IMPLANT
CLOTH BEACON ORANGE TIMEOUT ST (SAFETY) ×2 IMPLANT
EYE SHIELD UNIVERSAL CLEAR (GAUZE/BANDAGES/DRESSINGS) IMPLANT
FEE CATARACT SUITE SIGHTPATH (MISCELLANEOUS) ×2 IMPLANT
GLOVE BIOGEL PI IND STRL 7.0 (GLOVE) ×4 IMPLANT
LENS IOL RAYNER 22.5 (Intraocular Lens) ×1 IMPLANT
LENS IOL RAYONE EMV 22.5 (Intraocular Lens) IMPLANT
NDL HYPO 18GX1.5 BLUNT FILL (NEEDLE) ×2 IMPLANT
NEEDLE HYPO 18GX1.5 BLUNT FILL (NEEDLE) ×1 IMPLANT
PAD ARMBOARD 7.5X6 YLW CONV (MISCELLANEOUS) ×2 IMPLANT
RING MALYGIN 7.0 (MISCELLANEOUS) IMPLANT
SYR TB 1ML LL NO SAFETY (SYRINGE) ×2 IMPLANT
TAPE SURG TRANSPORE 1 IN (GAUZE/BANDAGES/DRESSINGS) IMPLANT
TAPE SURGICAL TRANSPORE 1 IN (GAUZE/BANDAGES/DRESSINGS) ×1
WATER STERILE IRR 250ML POUR (IV SOLUTION) ×2 IMPLANT

## 2022-11-20 NOTE — Transfer of Care (Signed)
Immediate Anesthesia Transfer of Care Note  Patient: Sylvia Turner  Procedure(s) Performed: CATARACT EXTRACTION PHACO AND INTRAOCULAR LENS PLACEMENT (IOC) (Right: Eye)  Patient Location: PACU  Anesthesia Type:MAC  Level of Consciousness: awake  Airway & Oxygen Therapy: Patient Spontanous Breathing  Post-op Assessment: Report given to RN and Post -op Vital signs reviewed and stable  Post vital signs: Reviewed and stable  Last Vitals:  Vitals Value Taken Time  BP    Temp    Pulse    Resp    SpO2      Last Pain:  Vitals:   11/20/22 0655  TempSrc: Oral  PainSc: 0-No pain      Patients Stated Pain Goal: 5 (14/97/02 6378)  Complications: No notable events documented.

## 2022-11-20 NOTE — Discharge Instructions (Signed)
Please discharge patient when stable, will follow up today with Dr. Lynnette Pote at the [facility] office immediately following discharge.  Leave shield in place until visit.  All paperwork with discharge instructions will be given at the office.  [facility] Address:  [address]  [place],  [address]  

## 2022-11-20 NOTE — Anesthesia Preprocedure Evaluation (Signed)
Anesthesia Evaluation  Patient identified by MRN, date of birth, ID band Patient awake    Reviewed: Allergy & Precautions, H&P , NPO status , Patient's Chart, lab work & pertinent test results  Airway Mallampati: II  TM Distance: >3 FB Neck ROM: Full    Dental no notable dental hx.    Pulmonary neg pulmonary ROS   Pulmonary exam normal breath sounds clear to auscultation       Cardiovascular negative cardio ROS Normal cardiovascular exam Rhythm:Regular Rate:Normal     Neuro/Psych negative neurological ROS  negative psych ROS   GI/Hepatic negative GI ROS, Neg liver ROS,,,  Endo/Other  Hypothyroidism    Renal/GU negative Renal ROS  negative genitourinary   Musculoskeletal negative musculoskeletal ROS (+)    Abdominal   Peds negative pediatric ROS (+)  Hematology negative hematology ROS (+)   Anesthesia Other Findings   Reproductive/Obstetrics negative OB ROS                             Anesthesia Physical Anesthesia Plan  ASA: 3  Anesthesia Plan: MAC   Post-op Pain Management:    Induction:   PONV Risk Score and Plan:   Airway Management Planned:   Additional Equipment:   Intra-op Plan:   Post-operative Plan:   Informed Consent: I have reviewed the patients History and Physical, chart, labs and discussed the procedure including the risks, benefits and alternatives for the proposed anesthesia with the patient or authorized representative who has indicated his/her understanding and acceptance.       Plan Discussed with: CRNA  Anesthesia Plan Comments:        Anesthesia Quick Evaluation

## 2022-11-20 NOTE — Interval H&P Note (Signed)
History and Physical Interval Note:  11/20/2022 7:45 AM  Sylvia Turner  has presented today for surgery, with the diagnosis of nuclear sclerosis age related cataract; right.  The various methods of treatment have been discussed with the patient and family. After consideration of risks, benefits and other options for treatment, the patient has consented to  Procedure(s): CATARACT EXTRACTION PHACO AND INTRAOCULAR LENS PLACEMENT ([place]) (Right) as a surgical intervention.  The patient's history has been reviewed, patient examined, no change in status, stable for surgery.  I have reviewed the patient's chart and labs.  Questions were answered to the patient's satisfaction.     Baruch Goldmann

## 2022-11-20 NOTE — Anesthesia Postprocedure Evaluation (Signed)
Anesthesia Post Note  Patient: Sylvia Turner  Procedure(s) Performed: CATARACT EXTRACTION PHACO AND INTRAOCULAR LENS PLACEMENT (IOC) (Right: Eye)  Patient location during evaluation: PACU Anesthesia Type: MAC Level of consciousness: awake and alert Pain management: pain level controlled Vital Signs Assessment: post-procedure vital signs reviewed and stable Respiratory status: spontaneous breathing, nonlabored ventilation, respiratory function stable and patient connected to nasal cannula oxygen Cardiovascular status: stable and blood pressure returned to baseline Postop Assessment: no apparent nausea or vomiting Anesthetic complications: no   No notable events documented.   Last Vitals:  Vitals:   11/20/22 0655 11/20/22 0818  BP: (!) 142/67 127/65  Pulse: 69 60  Resp: 20 18  Temp: 36.8 C 36.7 C  SpO2: 97% 98%    Last Pain:  Vitals:   11/20/22 0818  TempSrc: Oral  PainSc: 0-No pain                 Nicanor Alcon

## 2022-11-20 NOTE — Op Note (Signed)
Date of procedure: 11/20/22  Pre-operative diagnosis:  Visually significant combined form age-related cataract, Right Eye (H25.811)  Post-operative diagnosis:  Visually significant combined form age-related cataract, Right Eye (H25.811)  Procedure: Removal of cataract via phacoemulsification and insertion of intra-ocular lens Rayner [id number] +22.5D into the capsular bag of the Right Eye  Attending surgeon: Gerda Diss. Kyon Bentler, MD, MA  Anesthesia: MAC, Topical Akten  Complications: None  Estimated Blood Loss: <64m (minimal)  Specimens: None  Implants: As above  Indications:  Visually significant age-related cataract, Right Eye  Procedure:  The patient was seen and identified in the pre-operative area. The operative eye was identified and dilated.  The operative eye was marked.  Topical anesthesia was administered to the operative eye.     The patient was then to the operative suite and placed in the supine position.  A timeout was performed confirming the patient, procedure to be performed, and all other relevant information.   The patient's face was prepped and draped in the usual fashion for intra-ocular surgery.  A lid speculum was placed into the operative eye and the surgical microscope moved into place and focused.  A superotemporal paracentesis was created using a 20 gauge paracentesis blade.  Shugarcaine was injected into the anterior chamber.  Viscoelastic was injected into the anterior chamber.  A temporal clear-corneal main wound incision was created using a 2.440mmicrokeratome.  A continuous curvilinear capsulorrhexis was initiated using an irrigating cystitome and completed using capsulorrhexis forceps.  Hydrodissection and hydrodeliniation were performed.  Viscoelastic was injected into the anterior chamber.  A phacoemulsification handpiece and a chopper as a second instrument were used to remove the nucleus and epinucleus. The irrigation/aspiration handpiece was used to remove any  remaining cortical material.   The capsular bag was reinflated with viscoelastic, checked, and found to be intact.  The intraocular lens was inserted into the capsular bag.  The irrigation/aspiration handpiece was used to remove any remaining viscoelastic.  The clear corneal wound and paracentesis wounds were then hydrated and checked with Weck-Cels to be watertight. 0.70m79mf moxifloxacin was injected into the anterior chamber. The lid-speculum was removed.  The drape was removed.  The patient's face was cleaned with a wet and dry 4x4. A clear shield was taped over the eye. The patient was taken to the post-operative care unit in good condition, having tolerated the procedure well.  Post-Op Instructions: [facility]operative evaluation and will receive all other orders and instructions.

## 2022-11-25 DIAGNOSIS — Z08 Encounter for follow-up examination after completed treatment for malignant neoplasm: Secondary | ICD-10-CM | POA: Diagnosis not present

## 2022-11-25 DIAGNOSIS — L821 Other seborrheic keratosis: Secondary | ICD-10-CM | POA: Diagnosis not present

## 2022-11-25 DIAGNOSIS — Z85828 Personal history of other malignant neoplasm of skin: Secondary | ICD-10-CM | POA: Diagnosis not present

## 2022-11-25 DIAGNOSIS — X32XXXD Exposure to sunlight, subsequent encounter: Secondary | ICD-10-CM | POA: Diagnosis not present

## 2022-11-25 DIAGNOSIS — L570 Actinic keratosis: Secondary | ICD-10-CM | POA: Diagnosis not present

## 2022-11-26 DIAGNOSIS — H25812 Combined forms of age-related cataract, left eye: Secondary | ICD-10-CM | POA: Diagnosis not present

## 2022-11-27 ENCOUNTER — Encounter (HOSPITAL_COMMUNITY): Payer: Self-pay | Admitting: Ophthalmology

## 2022-12-01 ENCOUNTER — Encounter (HOSPITAL_COMMUNITY)
Admission: RE | Admit: 2022-12-01 | Discharge: 2022-12-01 | Disposition: A | Discharge status: Home / Self Care | Payer: Medicare HMO | Source: Ambulatory Visit | Attending: Ophthalmology | Admitting: Ophthalmology

## 2022-12-01 NOTE — H&P (Signed)
Surgical History & Physical  Patient Name: Sylvia Turner DOB: 1936-02-04  Surgery: Cataract extraction with intraocular lens implant phacoemulsification; Left Eye  Surgeon: Baruch Goldmann MD Surgery Date:  12-04-22 Pre-Op Date:  11-26-22  HPI: A 54 Yr. old female patient 1. The patient is returning after cataract surgery. The right eye is affected. Status post cataract surgery, which began 6 days ago: Since the last visit, the affected area is doing well. Patient is following medication instruction: Combo TID OD. Difficulties driving due to glare on sunny days and at night. Blurred vision OS. This is negatively affecting the patient's quality of life and the patient is unable to function adequately in life with the current level of vision. Patient would like to proceed with cataract sx OS.  Medical History: Cataracts no known family hx of ARMD/Glaucoma Thyroid Problems  Review of Systems Negative Allergic/Immunologic Negative Cardiovascular Negative Constitutional Negative Ear, Nose, Mouth & Throat Negative Endocrine Negative Eyes Negative Gastrointestinal Negative Genitourinary Negative Hemotologic/Lymphatic Negative Integumentary Negative Musculoskeletal Negative Neurological Negative Psychiatry Negative Respiratory  Social   Smoker, current status unknown   Medication Prednisolone-Moxifloxacin-Bromfenac,  Synthroid, Pramipexole, Trazodone,   Sx/Procedures Phaco c IOL OD,  No Sx Ever,   Drug Allergies   NKDA  History & Physical: Heent: cataract, left eye NECK: supple without bruits LUNGS: lungs clear to auscultation CV: regular rate and rhythm Abdomen: soft and non-tender Impression & Plan: Assessment: 1.  CATARACT EXTRACTION STATUS; Right Eye (Z98.41) 2.  COMBINED FORMS AGE RELATED CATARACT; Left Eye (H25.812) 3.  NUCLEAR SCLEROSIS AGE RELATED; Left Eye (H25.12)  Plan: 1.  1 week after cataract surgery. Doing well with improved vision and normal eye  pressure. Call with any problems or concerns. Continue Pred-Moxi-Brom 2x/day for 3 more weeks.  2.  Cataract accounts for the patient's decreased vision. This visual impairment is not correctable with a tolerable change in glasses or contact lenses. Cataract surgery with an implantation of a new lens should significantly improve the visual and functional status of the patient. Discussed all risks, benefits, alternatives, and potential complications. Discussed the procedures and recovery. Patient desires to have surgery. A-scan ordered and performed today for intra-ocular lens calculations. The surgery will be performed in order to improve vision for driving, reading, and for eye examinations. Recommend phacoemulsification with intra-ocular lens. Recommend Dextenza for post-operative pain and inflammation. Left Eye. Surgery required to correct imbalance of vision. Dilates well - shugarcaine by protocol. Set near goal -2.00  3.

## 2022-12-04 ENCOUNTER — Ambulatory Visit (HOSPITAL_COMMUNITY): Payer: Medicare HMO | Admitting: Anesthesiology

## 2022-12-04 ENCOUNTER — Ambulatory Visit (HOSPITAL_COMMUNITY)
Admission: RE | Admit: 2022-12-04 | Discharge: 2022-12-04 | Disposition: A | Discharge status: Home / Self Care | Payer: Medicare HMO | Source: Ambulatory Visit | Attending: Ophthalmology | Admitting: Ophthalmology

## 2022-12-04 ENCOUNTER — Encounter (HOSPITAL_COMMUNITY)
Admission: RE | Disposition: A | Discharge status: Home / Self Care | Payer: Self-pay | Source: Ambulatory Visit | Attending: Ophthalmology

## 2022-12-04 DIAGNOSIS — H2512 Age-related nuclear cataract, left eye: Secondary | ICD-10-CM | POA: Diagnosis not present

## 2022-12-04 DIAGNOSIS — H25812 Combined forms of age-related cataract, left eye: Secondary | ICD-10-CM | POA: Insufficient documentation

## 2022-12-04 DIAGNOSIS — Z87891 Personal history of nicotine dependence: Secondary | ICD-10-CM | POA: Diagnosis not present

## 2022-12-04 DIAGNOSIS — E039 Hypothyroidism, unspecified: Secondary | ICD-10-CM | POA: Diagnosis not present

## 2022-12-04 DIAGNOSIS — Z09 Encounter for follow-up examination after completed treatment for conditions other than malignant neoplasm: Secondary | ICD-10-CM | POA: Diagnosis not present

## 2022-12-04 HISTORY — PX: CATARACT EXTRACTION W/PHACO: SHX586

## 2022-12-04 SURGERY — PHACOEMULSIFICATION, CATARACT, WITH IOL INSERTION
Anesthesia: Monitor Anesthesia Care | Site: Eye | Laterality: Left

## 2022-12-04 MED ORDER — EPINEPHRINE PF 1 MG/ML IJ SOLN
INTRAMUSCULAR | Status: AC
  Filled 2022-12-04: qty 2

## 2022-12-04 MED ORDER — SODIUM HYALURONATE 23MG/ML IO SOSY
PREFILLED_SYRINGE | INTRAOCULAR | Status: DC | PRN
  Administered 2022-12-04: .6 mL via INTRAOCULAR

## 2022-12-04 MED ORDER — MOXIFLOXACIN HCL 5 MG/ML IO SOLN
INTRAOCULAR | Status: AC
  Filled 2022-12-04: qty 1

## 2022-12-04 MED ORDER — SODIUM HYALURONATE 10 MG/ML IO SOLUTION
PREFILLED_SYRINGE | INTRAOCULAR | Status: DC | PRN
  Administered 2022-12-04: .85 mL via INTRAOCULAR

## 2022-12-04 MED ORDER — LIDOCAINE HCL 3.5 % OP GEL
1.0000 | Freq: Once | OPHTHALMIC | Status: AC
  Administered 2022-12-04: 1 via OPHTHALMIC

## 2022-12-04 MED ORDER — MOXIFLOXACIN HCL 0.5 % OP SOLN
OPHTHALMIC | Status: DC | PRN
  Administered 2022-12-04: .3 mL via OPHTHALMIC

## 2022-12-04 MED ORDER — TETRACAINE HCL 0.5 % OP SOLN
1.0000 [drp] | OPHTHALMIC | Status: AC | PRN
  Administered 2022-12-04 (×3): 1 [drp] via OPHTHALMIC

## 2022-12-04 MED ORDER — STERILE WATER FOR IRRIGATION IR SOLN
Status: DC | PRN
  Administered 2022-12-04: 25 mL

## 2022-12-04 MED ORDER — PHENYLEPHRINE HCL 2.5 % OP SOLN
1.0000 [drp] | OPHTHALMIC | Status: AC | PRN
  Administered 2022-12-04 (×3): 1 [drp] via OPHTHALMIC

## 2022-12-04 MED ORDER — TROPICAMIDE 1 % OP SOLN
1.0000 [drp] | OPHTHALMIC | Status: AC | PRN
  Administered 2022-12-04 (×3): 1 [drp] via OPHTHALMIC

## 2022-12-04 MED ORDER — LIDOCAINE HCL (PF) 1 % IJ SOLN
INTRAOCULAR | Status: DC | PRN
  Administered 2022-12-04: 1 mL via OPHTHALMIC

## 2022-12-04 MED ORDER — POVIDONE-IODINE 5 % OP SOLN
OPHTHALMIC | Status: DC | PRN
  Administered 2022-12-04: 1 via OPHTHALMIC

## 2022-12-04 MED ORDER — LACTATED RINGERS IV SOLN: INTRAVENOUS | Status: DC

## 2022-12-04 MED ORDER — EPINEPHRINE PF 1 MG/ML IJ SOLN
INTRAOCULAR | Status: DC | PRN
  Administered 2022-12-04: 500 mL

## 2022-12-04 MED ORDER — BSS IO SOLN
INTRAOCULAR | Status: DC | PRN
  Administered 2022-12-04: 15 mL via INTRAOCULAR

## 2022-12-04 SURGICAL SUPPLY — 14 items
CATARACT SUITE SIGHTPATH (MISCELLANEOUS) ×1 IMPLANT
CLOTH BEACON ORANGE TIMEOUT ST (SAFETY) ×2 IMPLANT
EYE SHIELD UNIVERSAL CLEAR (GAUZE/BANDAGES/DRESSINGS) IMPLANT
FEE CATARACT SUITE SIGHTPATH (MISCELLANEOUS) ×2 IMPLANT
GLOVE BIOGEL PI IND STRL 7.0 (GLOVE) ×4 IMPLANT
LENS IOL RAYNER 22.0 (Intraocular Lens) ×1 IMPLANT
LENS IOL RAYONE EMV 22.0 (Intraocular Lens) IMPLANT
NDL HYPO 18GX1.5 BLUNT FILL (NEEDLE) ×2 IMPLANT
NEEDLE HYPO 18GX1.5 BLUNT FILL (NEEDLE) ×1 IMPLANT
PAD ARMBOARD 7.5X6 YLW CONV (MISCELLANEOUS) ×2 IMPLANT
SYR TB 1ML LL NO SAFETY (SYRINGE) ×2 IMPLANT
TAPE SURG TRANSPORE 1 IN (GAUZE/BANDAGES/DRESSINGS) IMPLANT
TAPE SURGICAL TRANSPORE 1 IN (GAUZE/BANDAGES/DRESSINGS) ×1
WATER STERILE IRR 250ML POUR (IV SOLUTION) ×2 IMPLANT

## 2022-12-04 NOTE — Anesthesia Postprocedure Evaluation (Signed)
Anesthesia Post Note  Patient: Sylvia Turner  Procedure(s) Performed: CATARACT EXTRACTION PHACO AND INTRAOCULAR LENS PLACEMENT (I[facility]) (Left: Eye)  Patient location during evaluation: Phase II Anesthesia Type: MAC Level of consciousness: awake and alert and oriented Pain management: pain level controlled Vital Signs Assessment: post-procedure vital signs reviewed and stable Respiratory status: spontaneous breathing, nonlabored ventilation and respiratory function stable Cardiovascular status: blood pressure returned to baseline and stable Postop Assessment: no apparent nausea or vomiting Anesthetic complications: no  No notable events documented.   Last Vitals:  Vitals:   12/04/22 1100 12/04/22 1212  BP:  (!) 127/57  Pulse: (!) 57 (!) 57  Resp: 15 15  Temp:  36.5 C  SpO2: 97% 98%    Last Pain:  Vitals:   12/04/22 1212  TempSrc: Oral  PainSc: 0-No pain                 Nataline Basara C Jonnathan Birman

## 2022-12-04 NOTE — Transfer of Care (Signed)
Immediate Anesthesia Transfer of Care Note  Patient: Sylvia Turner  Procedure(s) Performed: CATARACT EXTRACTION PHACO AND INTRAOCULAR LENS PLACEMENT (I[facility]) (Left: Eye)  Patient Location: Short Stay  Anesthesia Type:MAC  Level of Consciousness: awake, alert , oriented, and patient cooperative  Airway & Oxygen Therapy: Patient Spontanous Breathing  Post-op Assessment: Report given to RN, Post -op Vital signs reviewed and stable, and Patient moving all extremities  Post vital signs: Reviewed and stable  Last Vitals:  Vitals Value Taken Time  BP 127/57 12/04/22 1212  Temp 36.5 C 12/04/22 1212  Pulse 57 12/04/22 1212  Resp 15 12/04/22 1212  SpO2 98 % 12/04/22 1212    Last Pain:  Vitals:   12/04/22 1212  TempSrc: Oral  PainSc: 0-No pain         Complications: No notable events documented.

## 2022-12-04 NOTE — Interval H&P Note (Signed)
History and Physical Interval Note:  12/04/2022 11:43 AM  Sylvia Turner  has presented today for surgery, with the diagnosis of nuclear sclerosis age related cataract; left.  The various methods of treatment have been discussed with the patient and family. After consideration of risks, benefits and other options for treatment, the patient has consented to  Procedure(s) with comments: CATARACT EXTRACTION PHACO AND INTRAOCULAR LENS PLACEMENT (IOC) (Left) - CDE: as a surgical intervention.  The patient's history has been reviewed, patient examined, no change in status, stable for surgery.  I have reviewed the patient's chart and labs.  Questions were answered to the patient's satisfaction.     Baruch Goldmann

## 2022-12-04 NOTE — Discharge Instructions (Addendum)
Please discharge patient when stable, will follow up today with Dr. Wrzosek at the [facility] office immediately following discharge.  Leave shield in place until visit.  All paperwork with discharge instructions will be given at the office.  [facility] Address:  [address]  [place], [place] [address]  

## 2022-12-04 NOTE — Op Note (Signed)
Date of procedure: 12/04/22  Pre-operative diagnosis: Visually significant age-related combined cataract, Left Eye (H25.812)  Post-operative diagnosis: Visually significant age-related combined cataract, Left Eye (H25.812)  Procedure: Removal of cataract via phacoemulsification and insertion of intra-ocular lens Rayner [id number] +22.0D into the capsular bag of the Left Eye  Attending surgeon: Gerda Diss. Hulan Szumski, MD, MA  Anesthesia: MAC, Topical Akten  Complications: None  Estimated Blood Loss: <47m (minimal)  Specimens: None  Implants: As above  Indications:  Visually significant age-related cataract, Left Eye  Procedure:  The patient was seen and identified in the pre-operative area. The operative eye was identified and dilated.  The operative eye was marked.  Topical anesthesia was administered to the operative eye.     The patient was then to the operative suite and placed in the supine position.  A timeout was performed confirming the patient, procedure to be performed, and all other relevant information.   The patient's face was prepped and draped in the usual fashion for intra-ocular surgery.  A lid speculum was placed into the operative eye and the surgical microscope moved into place and focused.  An inferotemporal paracentesis was created using a 20 gauge paracentesis blade.  Shugarcaine was injected into the anterior chamber.  Viscoelastic was injected into the anterior chamber.  A temporal clear-corneal main wound incision was created using a 2.466mmicrokeratome.  A continuous curvilinear capsulorrhexis was initiated using an irrigating cystitome and completed using capsulorrhexis forceps.  Hydrodissection and hydrodeliniation were performed.  Viscoelastic was injected into the anterior chamber.  A phacoemulsification handpiece and a chopper as a second instrument were used to remove the nucleus and epinucleus. The irrigation/aspiration handpiece was used to remove any remaining  cortical material.   The capsular bag was reinflated with viscoelastic, checked, and found to be intact.  The intraocular lens was inserted into the capsular bag.  The irrigation/aspiration handpiece was used to remove any remaining viscoelastic.  The clear corneal wound and paracentesis wounds were then hydrated and checked with Weck-Cels to be watertight. 0.82m60mf moxifloxacin was injected into the anterior chamber. The lid-speculum was removed.  The drape was removed.  The patient's face was cleaned with a wet and dry 4x4.    A clear shield was taped over the eye. The patient was taken to the post-operative care unit in good condition, having tolerated the procedure well.  Post-Op Instructions: The patient will [facility] same day post-operative evaluation and will receive all other orders and instructions.

## 2022-12-04 NOTE — Anesthesia Preprocedure Evaluation (Signed)
Anesthesia Evaluation  Patient identified by MRN, date of birth, ID band Patient awake    Reviewed: Allergy & Precautions, H&P , NPO status , Patient's Chart, lab work & pertinent test results  Airway Mallampati: I  TM Distance: >3 FB Neck ROM: Full    Dental  (+) Upper Dentures, Partial Lower   Pulmonary pneumonia, resolved, former smoker   Pulmonary exam normal breath sounds clear to auscultation       Cardiovascular Exercise Tolerance: Good Normal cardiovascular exam Rhythm:Regular Rate:Normal  1. Left ventricular ejection fraction, by visual estimation, is 65 to  70%. The left ventricle has hyperdynamic function. There is no left  ventricular hypertrophy.   2. Left ventricular diastolic parameters are consistent with Grade I  diastolic dysfunction (impaired relaxation).   3. The left ventricle has no regional wall motion abnormalities.   4. Global right ventricle has normal systolic function.The right  ventricular size is normal. No increase in right ventricular wall  thickness.   5. Left atrial size was normal.   6. Right atrial size was normal.   7. The mitral valve is normal in structure. No evidence of mitral valve  regurgitation.   8. The tricuspid valve is normal in structure.   9. The aortic valve is normal in structure. Aortic valve regurgitation is  not visualized.  10. The pulmonic valve was not well visualized. Pulmonic valve  regurgitation is not visualized.  11. Moderately elevated pulmonary artery systolic pressure.     Neuro/Psych negative neurological ROS  negative psych ROS   GI/Hepatic negative GI ROS, Neg liver ROS,,,  Endo/Other  Hypothyroidism    Renal/GU negative Renal ROS  negative genitourinary   Musculoskeletal negative musculoskeletal ROS (+)    Abdominal   Peds negative pediatric ROS (+)  Hematology negative hematology ROS (+)   Anesthesia Other Findings On lasix   Reproductive/Obstetrics negative OB ROS                             Anesthesia Physical Anesthesia Plan  ASA: 2  Anesthesia Plan: MAC   Post-op Pain Management: Minimal or no pain anticipated   Induction: Intravenous  PONV Risk Score and Plan: Treatment may vary due to age or medical condition  Airway Management Planned: Nasal Cannula and Natural Airway  Additional Equipment:   Intra-op Plan:   Post-operative Plan:   Informed Consent: I have reviewed the patients History and Physical, chart, labs and discussed the procedure including the risks, benefits and alternatives for the proposed anesthesia with the patient or authorized representative who has indicated his/her understanding and acceptance.     Dental advisory given  Plan Discussed with: CRNA and Surgeon  Anesthesia Plan Comments:         Anesthesia Quick Evaluation

## 2022-12-10 ENCOUNTER — Encounter (HOSPITAL_COMMUNITY): Payer: Self-pay | Admitting: Ophthalmology

## 2022-12-23 ENCOUNTER — Ambulatory Visit (HOSPITAL_COMMUNITY)
Admission: RE | Admit: 2022-12-23 | Discharge: 2022-12-23 | Disposition: A | Discharge status: Home / Self Care | Payer: Medicare HMO | Source: Ambulatory Visit | Attending: Internal Medicine | Admitting: Internal Medicine

## 2022-12-23 ENCOUNTER — Encounter (HOSPITAL_COMMUNITY): Payer: Self-pay

## 2022-12-23 DIAGNOSIS — Z1231 Encounter for screening mammogram for malignant neoplasm of breast: Secondary | ICD-10-CM | POA: Diagnosis not present

## 2022-12-29 DIAGNOSIS — E039 Hypothyroidism, unspecified: Secondary | ICD-10-CM | POA: Diagnosis not present

## 2023-01-05 DIAGNOSIS — E039 Hypothyroidism, unspecified: Secondary | ICD-10-CM | POA: Diagnosis not present

## 2023-01-05 DIAGNOSIS — J069 Acute upper respiratory infection, unspecified: Secondary | ICD-10-CM | POA: Diagnosis not present

## 2023-01-14 DIAGNOSIS — H5213 Myopia, bilateral: Secondary | ICD-10-CM | POA: Diagnosis not present

## 2023-07-02 DIAGNOSIS — E039 Hypothyroidism, unspecified: Secondary | ICD-10-CM | POA: Diagnosis not present

## 2023-07-09 DIAGNOSIS — G2581 Restless legs syndrome: Secondary | ICD-10-CM | POA: Diagnosis not present

## 2023-07-09 DIAGNOSIS — E039 Hypothyroidism, unspecified: Secondary | ICD-10-CM | POA: Diagnosis not present

## 2023-07-20 DIAGNOSIS — Z85828 Personal history of other malignant neoplasm of skin: Secondary | ICD-10-CM | POA: Diagnosis not present

## 2023-07-20 DIAGNOSIS — Z08 Encounter for follow-up examination after completed treatment for malignant neoplasm: Secondary | ICD-10-CM | POA: Diagnosis not present

## 2023-10-04 DIAGNOSIS — G2581 Restless legs syndrome: Secondary | ICD-10-CM | POA: Diagnosis not present

## 2023-10-04 DIAGNOSIS — E039 Hypothyroidism, unspecified: Secondary | ICD-10-CM | POA: Diagnosis not present

## 2023-10-04 DIAGNOSIS — Z79899 Other long term (current) drug therapy: Secondary | ICD-10-CM | POA: Diagnosis not present

## 2023-10-04 DIAGNOSIS — R7301 Impaired fasting glucose: Secondary | ICD-10-CM | POA: Diagnosis not present

## 2023-10-04 DIAGNOSIS — G4700 Insomnia, unspecified: Secondary | ICD-10-CM | POA: Diagnosis not present

## 2023-10-04 DIAGNOSIS — M1990 Unspecified osteoarthritis, unspecified site: Secondary | ICD-10-CM | POA: Diagnosis not present

## 2023-10-11 DIAGNOSIS — G2581 Restless legs syndrome: Secondary | ICD-10-CM | POA: Diagnosis not present

## 2023-10-11 DIAGNOSIS — Z0001 Encounter for general adult medical examination with abnormal findings: Secondary | ICD-10-CM | POA: Diagnosis not present

## 2023-10-11 DIAGNOSIS — E039 Hypothyroidism, unspecified: Secondary | ICD-10-CM | POA: Diagnosis not present

## 2023-10-11 DIAGNOSIS — Z0000 Encounter for general adult medical examination without abnormal findings: Secondary | ICD-10-CM | POA: Diagnosis not present

## 2023-10-11 DIAGNOSIS — Z23 Encounter for immunization: Secondary | ICD-10-CM | POA: Diagnosis not present

## 2023-10-11 DIAGNOSIS — R7309 Other abnormal glucose: Secondary | ICD-10-CM | POA: Diagnosis not present

## 2023-10-11 DIAGNOSIS — M1990 Unspecified osteoarthritis, unspecified site: Secondary | ICD-10-CM | POA: Diagnosis not present

## 2023-11-19 DIAGNOSIS — S81801A Unspecified open wound, right lower leg, initial encounter: Secondary | ICD-10-CM | POA: Diagnosis not present

## 2023-12-10 DIAGNOSIS — S81801A Unspecified open wound, right lower leg, initial encounter: Secondary | ICD-10-CM | POA: Diagnosis not present

## 2023-12-22 DIAGNOSIS — L97312 Non-pressure chronic ulcer of right ankle with fat layer exposed: Secondary | ICD-10-CM | POA: Diagnosis not present

## 2023-12-22 DIAGNOSIS — R600 Localized edema: Secondary | ICD-10-CM | POA: Diagnosis not present

## 2023-12-28 DIAGNOSIS — I872 Venous insufficiency (chronic) (peripheral): Secondary | ICD-10-CM | POA: Diagnosis not present

## 2023-12-28 DIAGNOSIS — L97312 Non-pressure chronic ulcer of right ankle with fat layer exposed: Secondary | ICD-10-CM | POA: Diagnosis not present

## 2023-12-29 DIAGNOSIS — L97312 Non-pressure chronic ulcer of right ankle with fat layer exposed: Secondary | ICD-10-CM | POA: Diagnosis not present

## 2023-12-29 DIAGNOSIS — R600 Localized edema: Secondary | ICD-10-CM | POA: Diagnosis not present

## 2024-01-05 DIAGNOSIS — L97312 Non-pressure chronic ulcer of right ankle with fat layer exposed: Secondary | ICD-10-CM | POA: Diagnosis not present

## 2024-01-13 DIAGNOSIS — L97312 Non-pressure chronic ulcer of right ankle with fat layer exposed: Secondary | ICD-10-CM | POA: Diagnosis not present

## 2024-01-18 DIAGNOSIS — L97312 Non-pressure chronic ulcer of right ankle with fat layer exposed: Secondary | ICD-10-CM | POA: Diagnosis not present

## 2024-01-25 DIAGNOSIS — I87311 Chronic venous hypertension (idiopathic) with ulcer of right lower extremity: Secondary | ICD-10-CM | POA: Diagnosis not present

## 2024-01-25 DIAGNOSIS — L97312 Non-pressure chronic ulcer of right ankle with fat layer exposed: Secondary | ICD-10-CM | POA: Diagnosis not present

## 2024-01-27 DIAGNOSIS — L97312 Non-pressure chronic ulcer of right ankle with fat layer exposed: Secondary | ICD-10-CM | POA: Diagnosis not present

## 2024-01-27 DIAGNOSIS — I87311 Chronic venous hypertension (idiopathic) with ulcer of right lower extremity: Secondary | ICD-10-CM | POA: Diagnosis not present

## 2024-02-02 DIAGNOSIS — L97312 Non-pressure chronic ulcer of right ankle with fat layer exposed: Secondary | ICD-10-CM | POA: Diagnosis not present

## 2024-02-02 DIAGNOSIS — I87311 Chronic venous hypertension (idiopathic) with ulcer of right lower extremity: Secondary | ICD-10-CM | POA: Diagnosis not present

## 2024-02-09 DIAGNOSIS — L97312 Non-pressure chronic ulcer of right ankle with fat layer exposed: Secondary | ICD-10-CM | POA: Diagnosis not present

## 2024-02-09 DIAGNOSIS — I87311 Chronic venous hypertension (idiopathic) with ulcer of right lower extremity: Secondary | ICD-10-CM | POA: Diagnosis not present

## 2024-02-16 DIAGNOSIS — I87311 Chronic venous hypertension (idiopathic) with ulcer of right lower extremity: Secondary | ICD-10-CM | POA: Diagnosis not present

## 2024-02-16 DIAGNOSIS — L97312 Non-pressure chronic ulcer of right ankle with fat layer exposed: Secondary | ICD-10-CM | POA: Diagnosis not present

## 2024-02-23 DIAGNOSIS — L97312 Non-pressure chronic ulcer of right ankle with fat layer exposed: Secondary | ICD-10-CM | POA: Diagnosis not present

## 2024-02-23 DIAGNOSIS — I87311 Chronic venous hypertension (idiopathic) with ulcer of right lower extremity: Secondary | ICD-10-CM | POA: Diagnosis not present

## 2024-03-01 DIAGNOSIS — I87311 Chronic venous hypertension (idiopathic) with ulcer of right lower extremity: Secondary | ICD-10-CM | POA: Diagnosis not present

## 2024-03-01 DIAGNOSIS — L97312 Non-pressure chronic ulcer of right ankle with fat layer exposed: Secondary | ICD-10-CM | POA: Diagnosis not present

## 2024-03-08 DIAGNOSIS — L97919 Non-pressure chronic ulcer of unspecified part of right lower leg with unspecified severity: Secondary | ICD-10-CM | POA: Diagnosis not present

## 2024-03-08 DIAGNOSIS — L97312 Non-pressure chronic ulcer of right ankle with fat layer exposed: Secondary | ICD-10-CM | POA: Diagnosis not present

## 2024-03-08 DIAGNOSIS — I87311 Chronic venous hypertension (idiopathic) with ulcer of right lower extremity: Secondary | ICD-10-CM | POA: Diagnosis not present

## 2024-03-08 DIAGNOSIS — L308 Other specified dermatitis: Secondary | ICD-10-CM | POA: Diagnosis not present

## 2024-03-14 DIAGNOSIS — I739 Peripheral vascular disease, unspecified: Secondary | ICD-10-CM | POA: Diagnosis not present

## 2024-03-14 DIAGNOSIS — I83013 Varicose veins of right lower extremity with ulcer of ankle: Secondary | ICD-10-CM | POA: Diagnosis not present

## 2024-03-14 DIAGNOSIS — I7789 Other specified disorders of arteries and arterioles: Secondary | ICD-10-CM | POA: Diagnosis not present

## 2024-03-14 DIAGNOSIS — I872 Venous insufficiency (chronic) (peripheral): Secondary | ICD-10-CM | POA: Diagnosis not present

## 2024-03-14 DIAGNOSIS — L97319 Non-pressure chronic ulcer of right ankle with unspecified severity: Secondary | ICD-10-CM | POA: Diagnosis not present

## 2024-03-15 DIAGNOSIS — I87311 Chronic venous hypertension (idiopathic) with ulcer of right lower extremity: Secondary | ICD-10-CM | POA: Diagnosis not present

## 2024-03-15 DIAGNOSIS — L97312 Non-pressure chronic ulcer of right ankle with fat layer exposed: Secondary | ICD-10-CM | POA: Diagnosis not present

## 2024-03-29 DIAGNOSIS — L97312 Non-pressure chronic ulcer of right ankle with fat layer exposed: Secondary | ICD-10-CM | POA: Diagnosis not present

## 2024-03-29 DIAGNOSIS — I87311 Chronic venous hypertension (idiopathic) with ulcer of right lower extremity: Secondary | ICD-10-CM | POA: Diagnosis not present

## 2024-03-31 DIAGNOSIS — I87311 Chronic venous hypertension (idiopathic) with ulcer of right lower extremity: Secondary | ICD-10-CM | POA: Diagnosis not present

## 2024-04-07 DIAGNOSIS — S81801D Unspecified open wound, right lower leg, subsequent encounter: Secondary | ICD-10-CM | POA: Diagnosis not present

## 2024-04-12 DIAGNOSIS — I87311 Chronic venous hypertension (idiopathic) with ulcer of right lower extremity: Secondary | ICD-10-CM | POA: Diagnosis not present

## 2024-04-12 DIAGNOSIS — L97312 Non-pressure chronic ulcer of right ankle with fat layer exposed: Secondary | ICD-10-CM | POA: Diagnosis not present

## 2024-04-12 DIAGNOSIS — L97812 Non-pressure chronic ulcer of other part of right lower leg with fat layer exposed: Secondary | ICD-10-CM | POA: Diagnosis not present

## 2024-04-20 DIAGNOSIS — T8189XA Other complications of procedures, not elsewhere classified, initial encounter: Secondary | ICD-10-CM | POA: Diagnosis not present

## 2024-04-20 DIAGNOSIS — R600 Localized edema: Secondary | ICD-10-CM | POA: Diagnosis not present

## 2024-05-02 DIAGNOSIS — L97918 Non-pressure chronic ulcer of unspecified part of right lower leg with other specified severity: Secondary | ICD-10-CM | POA: Diagnosis not present

## 2024-05-02 DIAGNOSIS — I87311 Chronic venous hypertension (idiopathic) with ulcer of right lower extremity: Secondary | ICD-10-CM | POA: Diagnosis not present

## 2024-05-03 DIAGNOSIS — R600 Localized edema: Secondary | ICD-10-CM | POA: Diagnosis not present

## 2024-05-03 DIAGNOSIS — T8189XA Other complications of procedures, not elsewhere classified, initial encounter: Secondary | ICD-10-CM | POA: Diagnosis not present

## 2024-05-19 ENCOUNTER — Encounter: Payer: Self-pay | Admitting: Advanced Practice Midwife

## 2024-06-06 DIAGNOSIS — T8189XA Other complications of procedures, not elsewhere classified, initial encounter: Secondary | ICD-10-CM | POA: Diagnosis not present

## 2024-06-14 ENCOUNTER — Encounter: Payer: Self-pay | Admitting: Plastic Surgery

## 2024-06-14 ENCOUNTER — Ambulatory Visit: Admitting: Plastic Surgery

## 2024-06-14 VITALS — BP 134/69 | HR 88 | Ht 62.0 in | Wt 131.2 lb

## 2024-06-14 DIAGNOSIS — I83018 Varicose veins of right lower extremity with ulcer other part of lower leg: Secondary | ICD-10-CM | POA: Diagnosis not present

## 2024-06-14 DIAGNOSIS — I83001 Varicose veins of unspecified lower extremity with ulcer of thigh: Secondary | ICD-10-CM

## 2024-06-14 DIAGNOSIS — L97819 Non-pressure chronic ulcer of other part of right lower leg with unspecified severity: Secondary | ICD-10-CM | POA: Diagnosis not present

## 2024-06-14 DIAGNOSIS — L97101 Non-pressure chronic ulcer of unspecified thigh limited to breakdown of skin: Secondary | ICD-10-CM

## 2024-06-14 NOTE — Progress Notes (Signed)
 Referring Provider Sheryle Carwin, MD [address] [place],  [place] [address]   CC:  Chief Complaint  Patient presents with   Consult      Sylvia Turner is an 88 y.o. female.  HPI: Sylvia Turner is a 64-year-old female who is referred today for evaluation and management of 2 superficial wounds on the lateral aspect of her right leg.  She states that these wounds occurred approximately 6 months ago when she struck her leg the side of her bed.  She has been seen by her primary care physician by a vascular physician and a dermatologist.  She states that she has had an ablation of some type of the veins in her right leg.  Her dermatologist has her on a topical agent which has been helping with the wound healing.  She is interested in any modalities to get the wound closed.  Of note the patient had polio as a child in the left leg is notably smaller than her right leg.  She is ambulatory without difficulty.  She denies any other significant medical problems.  Allergies  Allergen Reactions   Hydrocodone Nausea And Vomiting   Other     Ex. Perfume smells, candle shop    Outpatient Encounter Medications as of 06/14/2024  Medication Sig   aspirin  81 MG chewable tablet Chew 1 tablet (81 mg total) by mouth daily.   Cyanocobalamin  (VITAMIN B-12 PO) Take 1 tablet by mouth daily.   dexamethasone  (DECADRON ) 6 MG tablet Take 1 tablet (6 mg total) by mouth daily.   doxycycline  (VIBRAMYCIN ) 100 MG capsule Take 1 capsule (100 mg total) by mouth 2 (two) times daily.   furosemide  (LASIX ) 20 MG tablet Please take 40 mg oral daily x7 days,then  20 mg oral daily  x7 days   gabapentin  (NEURONTIN ) 300 MG capsule Take 300 mg by mouth at bedtime.   levothyroxine  (SYNTHROID , LEVOTHROID) 100 MCG tablet Take 100 mcg by mouth daily before breakfast.   MAGNESIUM PO Take 1 tablet by mouth daily.   pantoprazole  (PROTONIX ) 40 MG tablet Take 1 tablet (40 mg total) by mouth daily.   pramipexole  (MIRAPEX ) 0.25 MG  tablet Take 2 tablets by mouth every evening.    traZODone  (DESYREL ) 50 MG tablet Take 1 tablet by mouth at bedtime.   TURMERIC PO Take 1 tablet by mouth daily.   valACYclovir  (VALTREX ) 1000 MG tablet Take 1 tablet (1,000 mg total) by mouth daily.   No facility-administered encounter medications on file as of 06/14/2024.     Past Medical History:  Diagnosis Date   Deaf L Ear   Hip problem    Post herpetic neuralgia     Past Surgical History:  Procedure Laterality Date   CATARACT EXTRACTION W/PHACO Right 11/20/2022   Procedure: CATARACT EXTRACTION PHACO AND INTRAOCULAR LENS PLACEMENT (IOC);  Surgeon: Harrie Agent, MD;  Location: [facility];  Service: Ophthalmology;  Laterality: Right;  CDE: 16.98   CATARACT EXTRACTION W/PHACO Left 12/04/2022   Procedure: CATARACT EXTRACTION PHACO AND INTRAOCULAR LENS PLACEMENT (IOC);  Surgeon: Harrie Agent, MD;  Location: [facility];  Service: Ophthalmology;  Laterality: Left;  CDE: 13.43   CHOLECYSTECTOMY     EXTERNAL EAR SURGERY      Family History  Problem Relation Age of Onset   Dementia Mother    Other Father        Black Lung    Social History   Social History Narrative   Patient lives at home with spouse.  Caffeine Use: 3 cups daily     Review of Systems General: Denies fevers, chills, weight loss CV: Denies chest pain, shortness of breath, palpitations Skin: Superficial wounds on the lateral aspect of the right leg just above the ankle.  She states these wounds are painful when touched however pain has improved as the wound is healed. Vascular: Patient denies any type of claudication symptoms.  She does note that she has had superficial varicosities for many years.  Physical Exam    06/14/2024    9:15 AM 12/04/2022   12:12 PM 12/04/2022   11:00 AM  Vitals with BMI  Height 5' 2    Weight 131 lbs 3 oz    BMI 23.99    Systolic 134 127   Diastolic 69 57   Pulse 88 57 57    General:  No acute distress,  Alert and oriented, Non-Toxic,  Normal speech and affect Skin: Patient has 2 superficial wounds which are granulating.  These are on the lateral aspect of the right leg above the lateral malleolus.  The larger more distal wound is 3.5 cm x 3.5 cm and the smaller wound which is more proximal is 1 x 1 cm.  There is hyperpigmentation around both of the wounds.  No evidence of infection Vascular: Patient has easily palpable dorsalis pedis and posterior tibial pulses in the right foot.  Compared to the left foot these are approximately 2+.  She does have vascular studies which show nonphysiologic blockage in the superficial femoral artery on the right.  Her ABIs on the right are slightly greater than 1.  She does not have any large varicosities.  She has minimal pretibial swelling. Mammogram: Not applicable Assessment/Plan Superficial wounds, lateral right leg: These appear to be venous stasis ulcers.  They are clean with good granulation in the base of both.  I discussed venous stasis disease with the patient.  She has already had what sounds like venous ablation which may be contributing to the improvement in her wounds.  She has vascular Doppler studies which showed no significant issue with the arterial system.  She is going to see if she can get copies of any venous Doppler studies that were performed.  We discussed the use of Vashe dressings to help remove some of the exudate from the wound.  We discussed the importance of compression and when she is in bed elevation.  I dressed her wound today with a pad and Mepilex border to see if she can get some relief when she wears her compression stockings.  We discussed the fact that venous stasis ulcers are notoriously difficult to get to heal.  I do think that a skin substitute application may be reasonable.  I have spoken with the [employer] rep and we will obtain 2 pieces to be applied in the office.  Will schedule her for a return on August 28 at 3:30 in the afternoon at which time I am  application of of the myriad product.  I spent 45 minutes reviewing the patient's chart examining the patient discussing pathophysiology and treatment coordinating care and documenting.  Leonce KATHEE [place] 06/14/2024, 10:10 AM

## 2024-06-15 DIAGNOSIS — I83013 Varicose veins of right lower extremity with ulcer of ankle: Secondary | ICD-10-CM | POA: Diagnosis not present

## 2024-06-15 DIAGNOSIS — I872 Venous insufficiency (chronic) (peripheral): Secondary | ICD-10-CM | POA: Diagnosis not present

## 2024-06-15 DIAGNOSIS — L97319 Non-pressure chronic ulcer of right ankle with unspecified severity: Secondary | ICD-10-CM | POA: Diagnosis not present

## 2024-06-28 DIAGNOSIS — H43393 Other vitreous opacities, bilateral: Secondary | ICD-10-CM | POA: Diagnosis not present

## 2024-06-29 ENCOUNTER — Encounter: Payer: Self-pay | Admitting: Plastic Surgery

## 2024-06-29 ENCOUNTER — Ambulatory Visit: Admitting: Plastic Surgery

## 2024-06-29 DIAGNOSIS — S91001D Unspecified open wound, right ankle, subsequent encounter: Secondary | ICD-10-CM

## 2024-06-29 DIAGNOSIS — I83001 Varicose veins of unspecified lower extremity with ulcer of thigh: Secondary | ICD-10-CM

## 2024-06-29 NOTE — Progress Notes (Signed)
 Sylvia Turner returns today for placement of a tissue matrix on the wound on her lateral right ankle.  She has been doing the dressing changes as requested and there is some improvement in the appearance of the wound.  Procedure: After cleansing the wound with Vashe a 4 x 3 cm portion of the Symphony tissue matrix was applied to the wound and held in place with Steri-Strips.  A silicone dressing was placed over the wound and symphony.  A Vashe gauze was placed over this and held in place with Curlex.  The wound measured 3 x 3.5 cm and superficial in depth.  She was given instructions on wound care and a follow-up appointment arranged in 10 to 14 days.  She will call the office for any questions or concerns.

## 2024-06-29 NOTE — Patient Instructions (Signed)
 Hello Miquel I have a literature on the surgery there is there is no literature for what he is talk about it is not that I know I got me

## 2024-07-12 ENCOUNTER — Encounter: Payer: Self-pay | Admitting: Plastic Surgery

## 2024-07-12 ENCOUNTER — Ambulatory Visit: Admitting: Plastic Surgery

## 2024-07-12 VITALS — BP 144/71 | HR 107 | Temp 99.4°F

## 2024-07-12 DIAGNOSIS — S91001D Unspecified open wound, right ankle, subsequent encounter: Secondary | ICD-10-CM | POA: Diagnosis not present

## 2024-07-12 DIAGNOSIS — I83001 Varicose veins of unspecified lower extremity with ulcer of thigh: Secondary | ICD-10-CM

## 2024-07-12 NOTE — Progress Notes (Addendum)
 Ms. Kincade returns today for evaluation of her right foot and ankle.  She had a tissue matrix placed in the office 10 days ago.  She states that she is still having discomfort in the area but she has done well with her dressing changes.  On examination the wound has significantly filled in since last time.  She has good granulation tissue throughout the entire wound.  A single layer of myriad was placed in the office today.  Ms. Waren will continue with the same dressing changes as she has been doing.   Will follow-up in the office next week.  Photographs obtained today with her consent.

## 2024-07-19 ENCOUNTER — Encounter: Payer: Self-pay | Admitting: Plastic Surgery

## 2024-07-19 ENCOUNTER — Ambulatory Visit: Admitting: Plastic Surgery

## 2024-07-19 VITALS — BP 157/70 | HR 77 | Ht 62.0 in | Wt 130.2 lb

## 2024-07-19 DIAGNOSIS — S91001D Unspecified open wound, right ankle, subsequent encounter: Secondary | ICD-10-CM

## 2024-07-19 DIAGNOSIS — I83001 Varicose veins of unspecified lower extremity with ulcer of thigh: Secondary | ICD-10-CM

## 2024-07-19 NOTE — Progress Notes (Signed)
 Ms. Rondeau follows up today for evaluation of her right ankle wound.  She states that she is still having discomfort and that she occasionally needs Motrin for this discomfort.  She is still doing her dressing changes as prescribed.  On evaluation the wound does appear to be very slowly healing.  It is now flush with the skin.  There is still some exudate in the wound but this is improved over last week.  She is encouraged to continue dressing changes.  She is also encouraged to wash the wound in the shower as tolerated.  She will follow-up in 2 weeks.

## 2024-08-02 ENCOUNTER — Ambulatory Visit: Admitting: Plastic Surgery

## 2024-08-02 VITALS — BP 138/70 | HR 78 | Ht 61.0 in | Wt 129.1 lb

## 2024-08-02 DIAGNOSIS — I83001 Varicose veins of unspecified lower extremity with ulcer of thigh: Secondary | ICD-10-CM

## 2024-08-02 DIAGNOSIS — S81801D Unspecified open wound, right lower leg, subsequent encounter: Secondary | ICD-10-CM

## 2024-08-02 NOTE — Progress Notes (Signed)
 Sylvia Turner returns today for evaluation of the wound on the lateral aspect of her right leg.  The wound is still continuing to heal with good granulation tissue in the base and a very very slow reepithelialization.  She states that it is uncomfortable especially at night when she is trying to sleep.  Otherwise the discomfort is tolerable.  The wound measures approximately 3 x 3 cm and is superficial in depth.  I placed 1 layer of endoform natural on the wound.  She is instructed to keep this moist.  The dressing will be Adaptic over the endoform with surgical lube on the Adaptic and a Vashe soaked gauze over the surgical lube.  The endoform and Adaptic should be left in place.  She will follow-up in 1 week.

## 2024-08-07 ENCOUNTER — Telehealth: Payer: Self-pay

## 2024-08-07 NOTE — Telephone Encounter (Signed)
 The patient called the clinic and stated that her leg wound has a foul odor and the dressing on it looks like scrambled eggs. From reading Dr. Adrian note this is the adaptic or endoform. The patient removed the dressing and is going to continue with the dressing changes Dr. Waddell told her to do. She had an appointment on Thursday with Dr. Waddell.

## 2024-08-10 ENCOUNTER — Ambulatory Visit: Admitting: Plastic Surgery

## 2024-08-10 VITALS — BP 147/72 | HR 68 | Ht 61.0 in | Wt 128.1 lb

## 2024-08-10 DIAGNOSIS — S81801D Unspecified open wound, right lower leg, subsequent encounter: Secondary | ICD-10-CM

## 2024-08-10 DIAGNOSIS — I83001 Varicose veins of unspecified lower extremity with ulcer of thigh: Secondary | ICD-10-CM

## 2024-08-10 NOTE — Progress Notes (Signed)
 Ms. Wrench returns today for evaluation.  She states that she had a significant amount of pain and discomfort and was not happy with the endoform which was placed last week.  Most of it was removed prior to her appointment today.  She is still having discomfort in the wound but notes more numbness in her foot today.  On evaluation the wound is stable with evidence of continued granulation tissue in the wound base.  The wound is completely filled in and measures 5 cm in greatest width and 4-1/2 cm in length with a superficial depth.  She has a palpable dorsalis pedis pulse with an intermittently palpable posterior tibial.  The toes are cold but there is capillary refill and she does not have any pain to palpation.  She states that this is normal for her.  Venous stasis ulcer: Right progress is slow I do believe that she is slowly healing this ulcer.  We discussed the importance of keeping her leg elevated and using any compression that she is able to tolerate.  I have asked her to return to her vascular surgeons just to ensure that there is nothing that I am missing in regards to the neuropathy in her foot.  She seems to have good arterial flow but I would appreciate a second opinion.  Return to see me in 2 weeks.  30 minutes spent examining patient discussing options changing dressings and documenting.

## 2024-08-24 ENCOUNTER — Telehealth: Payer: Self-pay | Admitting: *Deleted

## 2024-08-24 ENCOUNTER — Ambulatory Visit: Admitting: Plastic Surgery

## 2024-08-24 DIAGNOSIS — I83001 Varicose veins of unspecified lower extremity with ulcer of thigh: Secondary | ICD-10-CM

## 2024-08-24 DIAGNOSIS — S81801D Unspecified open wound, right lower leg, subsequent encounter: Secondary | ICD-10-CM | POA: Diagnosis not present

## 2024-08-24 DIAGNOSIS — L929 Granulomatous disorder of the skin and subcutaneous tissue, unspecified: Secondary | ICD-10-CM | POA: Diagnosis not present

## 2024-08-24 DIAGNOSIS — I87311 Chronic venous hypertension (idiopathic) with ulcer of right lower extremity: Secondary | ICD-10-CM | POA: Diagnosis not present

## 2024-08-24 NOTE — Telephone Encounter (Addendum)
 Faxed order,demographics,insurance information, and recent office notes to [facility] .  For medical supplies for the patient.  Confirmation received and copy scanned into the chart.//AR/CMA

## 2024-08-24 NOTE — Progress Notes (Signed)
 Ms. Kollman returns today.  She states that the wound is still painful but she is doing a better job with elevating her leg.  On evaluation both the larger lesion and the smaller lesion superior to it are clean and granulating.  The superior wound measures 1.5 x 2 x 0.2 cm.  The larger incision measures 5 x 4-1/2 x 0.2 cm  Her foot is warm and well-perfused.  I am going to change her dressing to a silver impregnated alginate.  She will return next week to ensure that she is comfortable with the new dressings.

## 2024-08-30 ENCOUNTER — Ambulatory Visit: Admitting: Plastic Surgery

## 2024-08-30 VITALS — BP 169/73 | HR 79 | Temp 98.7°F | Ht 61.0 in | Wt 131.4 lb

## 2024-08-30 DIAGNOSIS — S91001D Unspecified open wound, right ankle, subsequent encounter: Secondary | ICD-10-CM | POA: Diagnosis not present

## 2024-08-30 DIAGNOSIS — L97319 Non-pressure chronic ulcer of right ankle with unspecified severity: Secondary | ICD-10-CM

## 2024-08-30 NOTE — Progress Notes (Unsigned)
 Ms. Sylvia Turner returns today for her right ankle wound.  The wounds have not decreased in diameter however they look much cleaner with excellent granulation tissue throughout both.  She is tolerating the new dressing without difficulty but does need to change it daily.  Will continue the new dressings and follow-up with me in 2 weeks.

## 2024-08-31 ENCOUNTER — Ambulatory Visit: Admitting: Physician Assistant

## 2024-09-13 ENCOUNTER — Ambulatory Visit: Admitting: Plastic Surgery

## 2024-09-13 VITALS — BP 136/69 | HR 79

## 2024-09-13 DIAGNOSIS — I83013 Varicose veins of right lower extremity with ulcer of ankle: Secondary | ICD-10-CM | POA: Diagnosis not present

## 2024-09-13 DIAGNOSIS — L97319 Non-pressure chronic ulcer of right ankle with unspecified severity: Secondary | ICD-10-CM

## 2024-09-13 MED ORDER — MELOXICAM 7.5 MG PO TABS: 7.5000 mg | ORAL_TABLET | Freq: Every day | ORAL | 1 refills | Status: AC

## 2024-09-13 NOTE — Progress Notes (Signed)
 Ms. Sylvia Turner returns today for evaluation.  I have been seeing her now for 3 months for a chronic venous stasis ulcer on the lateral aspect of her right leg.  We have tried multiple combinations of dressing changes as well as placement of tissue replacement products.  She has filled in the wound and it has been stable until this visit and she has significantly increased the diameter of both the superior and inferior wounds.  Her pain has not improved at all.  She has excellent inflow to the foot as evidenced by easily palpable dorsalis pedis pulses.  I have discussed with her that skin grafting venous stasis wounds rarely works if there is no improvement with other skin substitutes in my experience.  I am not sure where to go from here other than possibly beginning Unna boot therapy.  I have told her I will place a consult to the wound care team for their assessment and treatment.  I will place a prescription for Mobic to help with discomfort.  She does not want any gabapentin  at this time.  I have asked her to call her vascular surgeon for reevaluation for their opinion.  She will follow-up with me the week after Thanksgiving.

## 2024-09-26 ENCOUNTER — Encounter: Attending: Physician Assistant | Admitting: Physician Assistant

## 2024-09-26 DIAGNOSIS — L97812 Non-pressure chronic ulcer of other part of right lower leg with fat layer exposed: Secondary | ICD-10-CM | POA: Insufficient documentation

## 2024-09-26 DIAGNOSIS — I87311 Chronic venous hypertension (idiopathic) with ulcer of right lower extremity: Secondary | ICD-10-CM | POA: Diagnosis not present

## 2024-09-26 DIAGNOSIS — E038 Other specified hypothyroidism: Secondary | ICD-10-CM | POA: Insufficient documentation

## 2024-09-26 DIAGNOSIS — S81811A Laceration without foreign body, right lower leg, initial encounter: Secondary | ICD-10-CM | POA: Diagnosis not present

## 2024-10-02 ENCOUNTER — Encounter

## 2024-10-02 DIAGNOSIS — E038 Other specified hypothyroidism: Secondary | ICD-10-CM | POA: Diagnosis not present

## 2024-10-02 DIAGNOSIS — I87311 Chronic venous hypertension (idiopathic) with ulcer of right lower extremity: Secondary | ICD-10-CM | POA: Diagnosis present

## 2024-10-02 DIAGNOSIS — L97812 Non-pressure chronic ulcer of other part of right lower leg with fat layer exposed: Secondary | ICD-10-CM | POA: Diagnosis not present

## 2024-10-04 ENCOUNTER — Ambulatory Visit: Admitting: Plastic Surgery

## 2024-10-09 ENCOUNTER — Encounter: Admitting: Internal Medicine

## 2024-10-09 DIAGNOSIS — I87311 Chronic venous hypertension (idiopathic) with ulcer of right lower extremity: Secondary | ICD-10-CM | POA: Diagnosis not present

## 2024-10-10 DIAGNOSIS — E038 Other specified hypothyroidism: Secondary | ICD-10-CM | POA: Diagnosis not present

## 2024-10-10 DIAGNOSIS — G2581 Restless legs syndrome: Secondary | ICD-10-CM | POA: Diagnosis not present

## 2024-10-10 DIAGNOSIS — L97812 Non-pressure chronic ulcer of other part of right lower leg with fat layer exposed: Secondary | ICD-10-CM | POA: Diagnosis not present

## 2024-10-10 DIAGNOSIS — G4700 Insomnia, unspecified: Secondary | ICD-10-CM | POA: Diagnosis not present

## 2024-10-10 DIAGNOSIS — Z79899 Other long term (current) drug therapy: Secondary | ICD-10-CM | POA: Diagnosis not present

## 2024-10-10 DIAGNOSIS — I87311 Chronic venous hypertension (idiopathic) with ulcer of right lower extremity: Secondary | ICD-10-CM | POA: Diagnosis not present

## 2024-10-10 DIAGNOSIS — R7301 Impaired fasting glucose: Secondary | ICD-10-CM | POA: Diagnosis not present

## 2024-10-10 DIAGNOSIS — M1990 Unspecified osteoarthritis, unspecified site: Secondary | ICD-10-CM | POA: Diagnosis not present

## 2024-10-17 ENCOUNTER — Encounter: Admitting: Internal Medicine

## 2024-10-17 DIAGNOSIS — I87311 Chronic venous hypertension (idiopathic) with ulcer of right lower extremity: Secondary | ICD-10-CM | POA: Diagnosis not present

## 2024-10-18 ENCOUNTER — Encounter: Admitting: Internal Medicine

## 2024-10-19 ENCOUNTER — Encounter

## 2024-10-19 DIAGNOSIS — I87311 Chronic venous hypertension (idiopathic) with ulcer of right lower extremity: Secondary | ICD-10-CM | POA: Diagnosis not present

## 2024-10-20 ENCOUNTER — Telehealth: Payer: Self-pay

## 2024-10-20 NOTE — Telephone Encounter (Signed)
 L/M trying to find out if patient had a biopsy and if so, where it was performed. Wound care is needing this.

## 2024-10-23 ENCOUNTER — Encounter: Admitting: Physician Assistant

## 2024-10-23 DIAGNOSIS — I87311 Chronic venous hypertension (idiopathic) with ulcer of right lower extremity: Secondary | ICD-10-CM | POA: Diagnosis not present

## 2024-10-30 ENCOUNTER — Encounter: Admitting: Physician Assistant

## 2024-10-30 DIAGNOSIS — I87311 Chronic venous hypertension (idiopathic) with ulcer of right lower extremity: Secondary | ICD-10-CM | POA: Diagnosis not present

## 2024-11-06 ENCOUNTER — Encounter: Attending: Physician Assistant | Admitting: Physician Assistant

## 2024-11-06 DIAGNOSIS — I87331 Chronic venous hypertension (idiopathic) with ulcer and inflammation of right lower extremity: Secondary | ICD-10-CM | POA: Diagnosis present

## 2024-11-06 DIAGNOSIS — I87311 Chronic venous hypertension (idiopathic) with ulcer of right lower extremity: Secondary | ICD-10-CM | POA: Diagnosis not present

## 2024-11-06 DIAGNOSIS — E038 Other specified hypothyroidism: Secondary | ICD-10-CM | POA: Insufficient documentation

## 2024-11-06 DIAGNOSIS — L97812 Non-pressure chronic ulcer of other part of right lower leg with fat layer exposed: Secondary | ICD-10-CM | POA: Diagnosis not present

## 2024-11-13 ENCOUNTER — Encounter: Admitting: Physician Assistant

## 2024-11-13 DIAGNOSIS — I87311 Chronic venous hypertension (idiopathic) with ulcer of right lower extremity: Secondary | ICD-10-CM | POA: Diagnosis not present

## 2024-11-20 ENCOUNTER — Encounter: Admitting: Physician Assistant

## 2024-11-20 DIAGNOSIS — I87311 Chronic venous hypertension (idiopathic) with ulcer of right lower extremity: Secondary | ICD-10-CM | POA: Diagnosis not present

## 2024-11-27 ENCOUNTER — Encounter: Admitting: Physician Assistant

## 2024-12-04 ENCOUNTER — Encounter: Admitting: Physician Assistant

## 2024-12-06 ENCOUNTER — Encounter: Admitting: Physician Assistant

## 2024-12-12 ENCOUNTER — Encounter: Admitting: Physician Assistant
